# Patient Record
Sex: Female | Born: 1955 | Race: White | Hispanic: No | Marital: Married | Smoking: Never smoker
Health system: Southern US, Community
[De-identification: ages and names within clinical notes are randomized; demographics above are authoritative.]

## PROBLEM LIST (undated history)

## (undated) DIAGNOSIS — E039 Hypothyroidism, unspecified: Secondary | ICD-10-CM

## (undated) DIAGNOSIS — E785 Hyperlipidemia, unspecified: Secondary | ICD-10-CM

## (undated) DIAGNOSIS — G4730 Sleep apnea, unspecified: Secondary | ICD-10-CM

## (undated) DIAGNOSIS — I4891 Unspecified atrial fibrillation: Secondary | ICD-10-CM

## (undated) DIAGNOSIS — J302 Other seasonal allergic rhinitis: Secondary | ICD-10-CM

## (undated) HISTORY — PX: TONSILLECTOMY: SUR1361

## (undated) HISTORY — DX: Hyperlipidemia, unspecified: E78.5

---

## 2004-09-24 ENCOUNTER — Ambulatory Visit: Payer: Self-pay

## 2004-11-20 ENCOUNTER — Ambulatory Visit: Payer: Self-pay

## 2006-07-19 ENCOUNTER — Ambulatory Visit: Payer: Self-pay

## 2008-02-14 ENCOUNTER — Ambulatory Visit: Payer: Self-pay

## 2008-04-09 ENCOUNTER — Ambulatory Visit: Payer: Self-pay | Admitting: Unknown Physician Specialty

## 2009-01-14 ENCOUNTER — Ambulatory Visit: Payer: Self-pay | Admitting: Family Medicine

## 2012-07-13 ENCOUNTER — Ambulatory Visit: Payer: Self-pay | Admitting: Family Medicine

## 2013-04-09 ENCOUNTER — Ambulatory Visit: Payer: Self-pay | Admitting: Family Medicine

## 2013-06-27 ENCOUNTER — Ambulatory Visit: Payer: Self-pay | Admitting: Family Medicine

## 2015-01-27 ENCOUNTER — Ambulatory Visit (INDEPENDENT_AMBULATORY_CARE_PROVIDER_SITE_OTHER): Payer: 59 | Admitting: Family Medicine

## 2015-01-27 ENCOUNTER — Encounter: Payer: Self-pay | Admitting: Family Medicine

## 2015-01-27 VITALS — BP 128/90 | HR 80 | Temp 98.3°F | Resp 16 | Ht 68.0 in | Wt 215.2 lb

## 2015-01-27 DIAGNOSIS — R7303 Prediabetes: Secondary | ICD-10-CM | POA: Insufficient documentation

## 2015-01-27 DIAGNOSIS — J45909 Unspecified asthma, uncomplicated: Secondary | ICD-10-CM | POA: Insufficient documentation

## 2015-01-27 DIAGNOSIS — E7800 Pure hypercholesterolemia, unspecified: Secondary | ICD-10-CM | POA: Insufficient documentation

## 2015-01-27 DIAGNOSIS — E559 Vitamin D deficiency, unspecified: Secondary | ICD-10-CM | POA: Insufficient documentation

## 2015-01-27 DIAGNOSIS — J069 Acute upper respiratory infection, unspecified: Secondary | ICD-10-CM

## 2015-01-27 DIAGNOSIS — C4491 Basal cell carcinoma of skin, unspecified: Secondary | ICD-10-CM | POA: Insufficient documentation

## 2015-01-27 DIAGNOSIS — J309 Allergic rhinitis, unspecified: Secondary | ICD-10-CM | POA: Insufficient documentation

## 2015-01-27 MED ORDER — HYDROCODONE-HOMATROPINE 5-1.5 MG/5ML PO SYRP: ORAL_SOLUTION | ORAL | Status: DC

## 2015-01-27 NOTE — Patient Instructions (Signed)
Discussed use of Mucinex D for congestion, Delsym for cough, and Benadryl for postnasal drainage 

## 2015-01-27 NOTE — Progress Notes (Signed)
Subjective:     Patient ID: Sheila Bender, female   DOB: May 26, 1956, 59 y.o.   MRN: [id number]  HPI  Chief Complaint  Patient presents with  . Sore Throat    Patient comes in office today with complaints of sore throat, cough and congestion since 8/6. Patient states that her throat only hurts on the right side as well as ear ache that has developed on right side. She describes ear pain as fullness in ears. Patient reports taking otc Day/Night Quil to treat symptoms  Reports she will be having allergy testing per ENT soon.   Review of Systems  Respiratory: Negative for shortness of breath and wheezing.        Objective:   Physical Exam  Constitutional: She appears well-developed and well-nourished. No distress.  Ears: T.M's intact without inflammation Throat: no erythema. Tonsils absent. Neck: no cervical adenopathy Lungs: clear     Assessment:    1. Upper respiratory infectio - HYDROcodone-homatropine (HYCODAN) 5-1.5 MG/5ML syrup; 5 ml 4-6 hours as needed for cough  Dispense: 240 mL; Refill: 0    Plan:    Discussed use of Mucinex D for congestion, Delsym for cough, and Benadryl for postnasal drainage

## 2015-05-21 ENCOUNTER — Other Ambulatory Visit: Payer: Self-pay | Admitting: Orthopedic Surgery

## 2015-05-21 DIAGNOSIS — M2391 Unspecified internal derangement of right knee: Secondary | ICD-10-CM

## 2015-06-02 ENCOUNTER — Ambulatory Visit (HOSPITAL_COMMUNITY)
Admission: RE | Admit: 2015-06-02 | Discharge: 2015-06-02 | Disposition: A | Discharge status: Home / Self Care | Payer: 59 | Source: Ambulatory Visit | Attending: Orthopedic Surgery | Admitting: Orthopedic Surgery

## 2015-06-02 DIAGNOSIS — S83241A Other tear of medial meniscus, current injury, right knee, initial encounter: Secondary | ICD-10-CM | POA: Diagnosis not present

## 2015-06-02 DIAGNOSIS — S82144A Nondisplaced bicondylar fracture of right tibia, initial encounter for closed fracture: Secondary | ICD-10-CM | POA: Diagnosis not present

## 2015-06-02 DIAGNOSIS — W11XXXA Fall on and from ladder, initial encounter: Secondary | ICD-10-CM | POA: Insufficient documentation

## 2015-06-02 DIAGNOSIS — M2391 Unspecified internal derangement of right knee: Secondary | ICD-10-CM | POA: Diagnosis not present

## 2015-06-11 ENCOUNTER — Ambulatory Visit: Payer: Self-pay

## 2015-12-24 ENCOUNTER — Emergency Department
Admission: EM | Admit: 2015-12-24 | Discharge: 2015-12-24 | Disposition: A | Discharge status: Home / Self Care | Payer: 59 | Attending: Emergency Medicine | Admitting: Emergency Medicine

## 2015-12-24 DIAGNOSIS — R002 Palpitations: Secondary | ICD-10-CM | POA: Diagnosis not present

## 2015-12-24 DIAGNOSIS — J45909 Unspecified asthma, uncomplicated: Secondary | ICD-10-CM | POA: Diagnosis not present

## 2015-12-24 DIAGNOSIS — E785 Hyperlipidemia, unspecified: Secondary | ICD-10-CM | POA: Insufficient documentation

## 2015-12-24 LAB — CBC
HEMATOCRIT: 43.5 % (ref 35.0–47.0)
HEMOGLOBIN: 14.6 g/dL (ref 12.0–16.0)
MCH: 29.3 pg (ref 26.0–34.0)
MCHC: 33.4 g/dL (ref 32.0–36.0)
MCV: 87.7 fL (ref 80.0–100.0)
Platelets: 258 10*3/uL (ref 150–440)
RBC: 4.97 MIL/uL (ref 3.80–5.20)
RDW: 12.9 % (ref 11.5–14.5)
WBC: 10 10*3/uL (ref 3.6–11.0)

## 2015-12-24 LAB — BASIC METABOLIC PANEL
ANION GAP: 8 (ref 5–15)
BUN: 15 mg/dL (ref 6–20)
CHLORIDE: 103 mmol/L (ref 101–111)
CO2: 27 mmol/L (ref 22–32)
Calcium: 9.4 mg/dL (ref 8.9–10.3)
Creatinine, Ser: 0.86 mg/dL (ref 0.44–1.00)
GFR calc non Af Amer: >60 mL/min (ref >60)
GLUCOSE: 138 mg/dL — AB (ref 65–99)
Potassium: 3.6 mmol/L (ref 3.5–5.1)
Sodium: 138 mmol/L (ref 135–145)

## 2015-12-24 LAB — TSH: TSH: 7.988 u[IU]/mL — ABNORMAL HIGH (ref 0.350–4.500)

## 2015-12-24 LAB — TROPONIN I: Troponin I: <0.03 ng/mL (ref <0.03)

## 2015-12-24 LAB — T4, FREE: Free T4: 0.81 ng/dL (ref 0.61–1.12)

## 2015-12-24 MED ORDER — METOPROLOL TARTRATE 25 MG PO TABS
25.0000 mg | ORAL_TABLET | Freq: Once | ORAL | Status: AC
  Administered 2015-12-24: 25 mg via ORAL
  Filled 2015-12-24 (×2): qty 1

## 2015-12-24 MED ORDER — METOPROLOL TARTRATE 25 MG PO TABS: 25.0000 mg | ORAL_TABLET | Freq: Two times a day (BID) | ORAL | Status: AC

## 2015-12-24 NOTE — Discharge Instructions (Signed)

## 2015-12-24 NOTE — ED Provider Notes (Signed)
[facility] Emergency Department Provider Note        Time seen: ----------------------------------------- 9:21 PM on 12/24/2015 -----------------------------------------    I have reviewed the triage vital signs and the nursing notes.   HISTORY  Chief Complaint Palpitations    HPI Sheila Bender is a 60 y.o. female who presents to ER for palpitations since last week intermittently. Patient states she's probably had 6 or more episodes where she feels her heart is racing. Patient states it feels regular, denies recent illness or pain. She currently does not feel any palpitations, no vomiting, shortness of breath or fever. She did have episodes of nausea with the palpitations. Patient reports that several years ago she was noted to have an abnormal EKG so she underwent stress testing which was normal.   Past Medical History  Diagnosis Date  . Hyperlipidemia     Patient Active Problem List   Diagnosis Date Noted  . Allergic rhinitis 01/27/2015  . Basal cell carcinoma 01/27/2015  . Hypercholesteremia 01/27/2015  . Borderline diabetes 01/27/2015  . RAD (reactive airway disease) 01/27/2015  . Avitaminosis D 01/27/2015    Past Surgical History  Procedure Laterality Date  . Tonsillectomy      Allergies Sulfa antibiotics  Social History Social History  Substance Use Topics  . Smoking status: Never Smoker   . Smokeless tobacco: Never Used  . Alcohol Use: Not on file    Review of Systems Constitutional: Negative for fever. Cardiovascular: Negative for chest pain.Positive for palpitations Respiratory: Negative for shortness of breath. Gastrointestinal: Negative for abdominal pain, vomiting and diarrhea. Genitourinary: Negative for dysuria. Musculoskeletal: Negative for back pain. Skin: Negative for rash. Neurological: Negative for headaches, focal weakness or numbness.  10-point ROS otherwise  negative.  ____________________________________________   PHYSICAL EXAM:  VITAL SIGNS: ED Triage Vitals  Enc Vitals Group     BP 12/24/15 2019 196/86 mmHg     Pulse Rate 12/24/15 2019 88     Resp 12/24/15 2019 18     Temp 12/24/15 2019 98.2 F (36.8 C)     Temp Source 12/24/15 2019 Oral     SpO2 12/24/15 2019 100 %     Weight 12/24/15 2019 212 lb (96.163 kg)     Height 12/24/15 2019 5\' 8"  (1.727 m)     Head Cir --      Peak Flow --      Pain Score --      Pain Loc --      Pain Edu? --      Excl. in [place]? --     Constitutional: Alert and oriented. Well appearing and in no distress. Eyes: Conjunctivae are normal. PERRL. Normal extraocular movements. ENT   Head: Normocephalic and atraumatic.   Nose: No congestion/rhinnorhea.   Mouth/Throat: Mucous membranes are moist.   Neck: No stridor. Cardiovascular: Normal rate, regular rhythm. No murmurs, rubs, or gallops. Respiratory: Normal respiratory effort without tachypnea nor retractions. Breath sounds are clear and equal bilaterally. No wheezes/rales/rhonchi. Gastrointestinal: Soft and nontender. Normal bowel sounds Musculoskeletal: Nontender with normal range of motion in all extremities. No lower extremity tenderness nor edema. Neurologic:  Normal speech and language. No gross focal neurologic deficits are appreciated.  Skin:  Skin is warm, dry and intact. No rash noted. Psychiatric: Mood and affect are normal. Speech and behavior are normal.  ____________________________________________  EKG: Interpreted by me. Sinus rhythm rate 89 bpm, normal PR interval, normal QRS width, normal QT interval. LVH with repolarization, possible septal  infarct age indeterminate  ____________________________________________  ED COURSE:  Pertinent labs & imaging results that were available during my care of the patient were reviewed by me and considered in my medical decision making (see chart for details). Patient presents to ER  with palpitations of unclear etiology. We'll check basic labs and observe on the monitor. ____________________________________________    LABS (pertinent positives/negatives)  Labs Reviewed  BASIC METABOLIC PANEL - Abnormal; Notable for the following:    Glucose, Bld 138 (*)    All other components within normal limits  CBC  TROPONIN I  TSH  T4, FREE   ____________________________________________  FINAL ASSESSMENT AND PLAN  Palpitations  Plan: Patient with labs as dictated above. Patient observed on the monitor to have an occasional PVC. I will place her on low-dose metoprolol and she was given the first dose tonight. She will be advised to have close follow-up with cardiology for evaluation.   Earleen Newport, MD   Note: This dictation was prepared with Dragon dictation. Any transcriptional errors that result from this process are unintentional   Earleen Newport, MD 12/24/15 2200

## 2015-12-24 NOTE — ED Notes (Signed)
Pt in with co palpitations since last week intermittently. Has never been seen for the same by pmd, states does not feel palpitations at this time, no n,v,d or dysuria.

## 2015-12-25 DIAGNOSIS — R002 Palpitations: Secondary | ICD-10-CM | POA: Insufficient documentation

## 2016-01-01 ENCOUNTER — Encounter: Payer: Self-pay | Admitting: Family Medicine

## 2016-01-01 ENCOUNTER — Ambulatory Visit (INDEPENDENT_AMBULATORY_CARE_PROVIDER_SITE_OTHER): Payer: 59 | Admitting: Family Medicine

## 2016-01-01 VITALS — BP 140/92 | HR 72 | Temp 98.2°F | Resp 16 | Wt 221.0 lb

## 2016-01-01 DIAGNOSIS — E7800 Pure hypercholesterolemia, unspecified: Secondary | ICD-10-CM | POA: Diagnosis not present

## 2016-01-01 DIAGNOSIS — R7989 Other specified abnormal findings of blood chemistry: Secondary | ICD-10-CM

## 2016-01-01 DIAGNOSIS — R002 Palpitations: Secondary | ICD-10-CM | POA: Diagnosis not present

## 2016-01-01 DIAGNOSIS — R946 Abnormal results of thyroid function studies: Secondary | ICD-10-CM | POA: Diagnosis not present

## 2016-01-01 NOTE — Progress Notes (Signed)
Subjective:     Patient ID: Sheila Bender, female   DOB: 11-13-1955, 60 y.o.   MRN: [id number]  HPI  Chief Complaint  Patient presents with  . Abnormal Lab    Pt was seen in ED on 12/24/2015 for palpitations. Was found to have a TSH of 7.998. Was advised by cardiologist to see PCP for possible hypothyroidism. Has not had any palpititions since starting Metoprolol. Denies heat intolerance, bowel changes, swelling, BM changes.  States she has a Holter monitor and echocardiogram pending. She was told they were going to set up a sleep study as well. Does not report any hypothyroid sx.   Review of Systems     Objective:   Physical Exam  Constitutional: She appears well-developed and well-nourished. No distress.  Neck: No thyromegaly present.  Cardiovascular: Normal rate and regular rhythm.   Pulmonary/Chest: Breath sounds normal.       Assessment:    1. Palpitations: complete evaluation per cardiology.  2. Elevated TSH: Normal free T4  3. Hypercholesteremia - Lipid panel    Plan:    Provided with Epworth screen. We will repeat thyroid labs in 4 weeks or sooner if having symptoms. She will provide cardiology with copy of lipid profile when available.

## 2016-01-01 NOTE — Patient Instructions (Signed)
Complete Epworth screen. Complete cardiology evaluation. Repeat thyroid labs in 4 weeks. We will call you about the thyroid test labs.

## 2016-01-02 LAB — LIPID PANEL
Chol/HDL Ratio: 2.8 ratio units (ref 0.0–4.4)
Cholesterol, Total: 199 mg/dL (ref 100–199)
HDL: 71 mg/dL (ref >39)
LDL Calculated: 100 mg/dL — ABNORMAL HIGH (ref 0–99)
Triglycerides: 138 mg/dL (ref 0–149)
VLDL CHOLESTEROL CAL: 28 mg/dL (ref 5–40)

## 2016-03-03 ENCOUNTER — Other Ambulatory Visit: Payer: Self-pay | Admitting: Physician Assistant

## 2016-03-03 DIAGNOSIS — Z1231 Encounter for screening mammogram for malignant neoplasm of breast: Secondary | ICD-10-CM

## 2016-03-25 ENCOUNTER — Ambulatory Visit
Admission: RE | Admit: 2016-03-25 | Discharge: 2016-03-25 | Disposition: A | Discharge status: Home / Self Care | Payer: 59 | Source: Ambulatory Visit | Attending: Physician Assistant | Admitting: Physician Assistant

## 2016-03-25 DIAGNOSIS — Z1231 Encounter for screening mammogram for malignant neoplasm of breast: Secondary | ICD-10-CM | POA: Insufficient documentation

## 2016-06-10 ENCOUNTER — Other Ambulatory Visit
Admission: RE | Admit: 2016-06-10 | Discharge: 2016-06-10 | Disposition: A | Discharge status: Home / Self Care | Payer: 59 | Source: Ambulatory Visit | Attending: Internal Medicine | Admitting: Internal Medicine

## 2016-06-10 DIAGNOSIS — M25461 Effusion, right knee: Secondary | ICD-10-CM | POA: Insufficient documentation

## 2016-06-10 DIAGNOSIS — M25561 Pain in right knee: Secondary | ICD-10-CM | POA: Insufficient documentation

## 2016-06-10 DIAGNOSIS — G8929 Other chronic pain: Secondary | ICD-10-CM | POA: Diagnosis present

## 2016-06-10 LAB — SYNOVIAL CELL COUNT + DIFF, W/ CRYSTALS
Crystals, Fluid: NONE SEEN
EOSINOPHILS-SYNOVIAL: 0 %
LYMPHOCYTES-SYNOVIAL FLD: 55 %
MONOCYTE-MACROPHAGE-SYNOVIAL FLUID: 28 %
NEUTROPHIL, SYNOVIAL: 17 %
WBC, Synovial: 761 /mm3 — ABNORMAL HIGH (ref 0–200)

## 2016-07-23 ENCOUNTER — Encounter
Admission: RE | Admit: 2016-07-23 | Discharge: 2016-07-23 | Disposition: A | Discharge status: Home / Self Care | Payer: 59 | Source: Ambulatory Visit | Attending: Orthopedic Surgery | Admitting: Orthopedic Surgery

## 2016-07-23 DIAGNOSIS — Z01818 Encounter for other preprocedural examination: Secondary | ICD-10-CM | POA: Diagnosis not present

## 2016-07-23 DIAGNOSIS — Z8679 Personal history of other diseases of the circulatory system: Secondary | ICD-10-CM | POA: Diagnosis not present

## 2016-07-23 HISTORY — DX: Other seasonal allergic rhinitis: J30.2

## 2016-07-23 HISTORY — DX: Sleep apnea, unspecified: G47.30

## 2016-07-23 HISTORY — DX: Unspecified atrial fibrillation: I48.91

## 2016-07-23 HISTORY — DX: Hypothyroidism, unspecified: E03.9

## 2016-07-23 NOTE — Pre-Procedure Instructions (Signed)
Component Name Value Ref Range  LV Ejection Fraction (%) 55   Aortic Valve Stenosis Grade none   Aortic Valve Regurgitation Grade none   Aortic Valve Max Velocity (m/s) 1.7 m/sec   Aortic Valve Stenosis Mean Gradient (mmHg) 5.0 mmHg   Mitral Valve Regurgitation Grade mild   Mitral Valve Stenosis Grade none   Tricuspid Valve Regurgitation Grade mild   Tricuspid Valve Regurgitation Max Velocity (m/s) 3.1 m/sec   Right Ventricle Systolic Pressure (mmHg) XX123456 mmHg   LV End Diastolic Diameter (cm) 5.3 cm  LV End Systolic Diameter (cm) 2.9 cm  LV Septum Wall Thickness (cm) 1.1 cm  LV Posterior Wall Thickness (cm) 1.0 cm  Left Atrium Diameter (cm) 3.8 cm  Result Narrative   CARDIOLOGY DEPARTMENT KIERSYN, DRUKER [id number]  [facility] [id number]  [address] [place], [place] [address]Date: 01/12/2016 02:27 PM  Adult Female Age: 61 yrs  ECHOCARDIOGRAM REPORT Outpatient  KC::KCWC STUDY:CHEST WALL TAPE:0000:00: 0:00:00 MD1:MELVILLE, BONNIE JEAN  ECHO:Yes DOPPLER:YesFILE:0000-000-000 BP: 130/90 mmHg COLOR:YesCONTRAST:No MACHINE:[place] Height: 67 in RV BIOPSY:No 3D:NoSOUND QLTY:ModerateWeight: 220 lb  MEDIUM:None BSA: 2.1 m2  ___________________________________________________________________________________________  HISTORY:Palpitations REASON:Assess, LV function INDICATION:R00.2  Palpitations ___________________________________________________________________________________________  ECHOCARDIOGRAPHIC MEASUREMENTS 2D DIMENSIONS AORTA ValuesNormal RangeMAIN PAValuesNormal Range Annulus:nm* [2.1 - 2.5]PA Main:nm* [1.5 - 2.1] Aorta Sin:nm* [2.7 - 3.3] RIGHT VENTRICLE ST Junction:nm* [2.3 - 2.9]RV Base:nm* [ < 4.2] Asc.Aorta:nm* [2.3 - 3.1] RV Mid:3.5 cm[ < 3.5]  LEFT VENTRICLERV Length:nm* [ < 8.6] LVIDd:5.3 cm[3.9 - 5.3] INFERIOR VENA CAVA LVIDs:2.9 cmMax. IVC:nm* [ <= 2.1]  FS:44.5 %[> 25]Min. IVC:nm* SWT:1.1 cm[0.5 - 0.9] ------------------ PWT:1.00 cm [0.5 - 0.9] nm* - not measured  LEFT ATRIUM LA Diam:3.8 cm[2.7 - 3.8] LA A4C Area:nm* [ < 20] LA Volume:nm* [22 - 52] ___________________________________________________________________________________________  ECHOCARDIOGRAPHIC DESCRIPTIONS  AORTIC ROOT Size:Normal Dissection:INDETERM FOR DISSECTION  AORTIC VALVE Leaflets:Tricuspid Morphology:Normal Mobility:Fully mobile  LEFT VENTRICLE Size:NormalAnterior:Normal  Contraction:Normal Lateral:Normal Closest EF:>55% (Estimated)Septal:Normal  LV Masses:No Masses Apical:Normal  [id number] Posterior:Normal Dias.FxClass:(Grade 1) relaxation abnormal, E/A reversal  MITRAL VALVE Leaflets:NormalMobility:Fully  mobile Morphology:Normal  LEFT ATRIUM Size:Normal LA Masses:No masses  IA Septum:Normal IAS  MAIN PA Size:Normal  PULMONIC VALVE Morphology:NormalMobility:Fully mobile  RIGHT VENTRICLE  RV Masses:No Masses Size:Normal  Free Wall:Normal Contraction:Normal  TRICUSPID VALVE Leaflets:NormalMobility:Fully mobile Morphology:Normal  RIGHT ATRIUM Size:NormalRA Other:None  RA Mass:No masses  PERICARDIUM  Fluid:No effusion  INFERIOR VENACAVA Size:Normal Normal respiratory collapse  _____________________________________________________________________  DOPPLER ECHO and OTHER SPECIAL PROCEDURES  Aortic:No ARNo AS 166.0 cm/sec peak vel11.0 mmHg peak grad 5.0 mmHg mean grad 3.2 cm^2 by DOPPLER   Mitral:MILD MRNo MS  5.1 cm^2 by DOPPLER MV Inflow E Vel=111.0 cm/sec MV Annulus E'Vel=6.2 cm/sec E/E'Ratio=17.9  Tricuspid:MILD TRNo TS 314.0 cm/sec peak TR vel 44.4 mmHg peak RV pressure  Pulmonary:TRIVIAL PR No PS    ___________________________________________________________________________________________  INTERPRETATION NORMAL LEFT VENTRICULAR SYSTOLIC FUNCTION WITH AN ESTIMATED EF = 60 % NORMAL RIGHT VENTRICULAR SYSTOLIC FUNCTION MILD TRICUSPID AND MITRAL VALVE REGURGITATION NO VALVULAR STENOSIS  ___________________________________________________________________________________________  Electronically signed by: MD Serafina Royals on 01/12/2016 04:00 PM Performed By: Johnathan Hausen, RDCS, RVT Ordering Physician: Etta Quill ___________________________________________________________________________________________  Status Results Details    Appointment on 01/12/2016 [place]")' [email or web address]/">Encounter Summary

## 2016-07-23 NOTE — Patient Instructions (Signed)
  Your procedure is scheduled on: 07/29/16 Thurs Report to Same Day Surgery 2nd floor medical mall [place] Entrance-take elevator on left to 2nd floor.  Check in with surgery information desk.) To find out your arrival time please call [phone] between 1PM - 3PM on 07/28/16 Wed  Remember: Instructions that are not followed completely may result in serious medical risk, up to and including death, or upon the discretion of your surgeon and anesthesiologist your surgery may need to be rescheduled.    _x___ 1. Do not eat food or drink liquids after midnight. No gum chewing or hard candies.     __x__ 2. No Alcohol for 24 hours before or after surgery.   __x__3. No Smoking for 24 prior to surgery.   ____  4. Bring all medications with you on the day of surgery if instructed.    __x__ 5. Notify your doctor if there is any change in your medical condition     (cold, fever, infections).     Do not wear jewelry, make-up, hairpins, clips or nail polish.  Do not wear lotions, powders, or perfumes. You may wear deodorant.  Do not shave 48 hours prior to surgery. Men may shave face and neck.  Do not bring valuables to the hospital.    [facility] is not responsible for any belongings or valuables.               Contacts, dentures or bridgework may not be worn into surgery.  Leave your suitcase in the car. After surgery it may be brought to your room.  For patients admitted to the hospital, discharge time is determined by your treatment team.   Patients discharged the day of surgery will not be allowed to drive home.  You will need someone to drive you home and stay with you the night of your procedure.    Please read over the following fact sheets that you were given:   [facility] Preparing for Surgery and or MRSA Information   _x___ Take these medicines the morning of surgery with A SIP OF WATER:    1. metoprolol tartrate   2.  3.  4.  5.  6.  ____Fleets enema or Magnesium  Citrate as directed.   _x___ Use CHG Soap or sage wipes as directed on instruction sheet   ____ Use inhalers on the day of surgery and bring to hospital day of surgery  ____ Stop metformin 2 days prior to surgery    ____ Take 1/2 of usual insulin dose the night before surgery and none on the morning of           surgery.   _x___ Stop Aspirin, Coumadin, Pllavix ,Eliquis, Effient, or Pradaxa  Ask cardiologist if OK to spot Eliquis 2 days before surgery.  x__ Stop Anti-inflammatories such as Advil, Aleve, Ibuprofen, Motrin, Naproxen,          Naprosyn, Goodies powders or aspirin products. Ok to take Tylenol.   ____ Stop supplements until after surgery.    __x__ Bring C-Pap to the hospital.

## 2016-07-26 NOTE — Pre-Procedure Instructions (Addendum)
EKG TO DR Nehemiah Massed. SPOKE WITH PATRICIA

## 2016-07-28 NOTE — Pre-Procedure Instructions (Signed)
CLEARED BY DR KOWALSKI 

## 2016-07-29 ENCOUNTER — Encounter
Admission: RE | Disposition: A | Discharge status: Home / Self Care | Payer: Self-pay | Source: Ambulatory Visit | Attending: Orthopedic Surgery

## 2016-07-29 ENCOUNTER — Encounter: Payer: Self-pay | Admitting: *Deleted

## 2016-07-29 ENCOUNTER — Ambulatory Visit: Payer: 59 | Admitting: Anesthesiology

## 2016-07-29 ENCOUNTER — Ambulatory Visit
Admission: RE | Admit: 2016-07-29 | Discharge: 2016-07-29 | Disposition: A | Discharge status: Home / Self Care | Payer: 59 | Source: Ambulatory Visit | Attending: Orthopedic Surgery | Admitting: Orthopedic Surgery

## 2016-07-29 DIAGNOSIS — S83241A Other tear of medial meniscus, current injury, right knee, initial encounter: Secondary | ICD-10-CM | POA: Insufficient documentation

## 2016-07-29 DIAGNOSIS — E039 Hypothyroidism, unspecified: Secondary | ICD-10-CM | POA: Insufficient documentation

## 2016-07-29 DIAGNOSIS — M2241 Chondromalacia patellae, right knee: Secondary | ICD-10-CM | POA: Diagnosis not present

## 2016-07-29 DIAGNOSIS — Z79899 Other long term (current) drug therapy: Secondary | ICD-10-CM | POA: Diagnosis not present

## 2016-07-29 DIAGNOSIS — X58XXXA Exposure to other specified factors, initial encounter: Secondary | ICD-10-CM | POA: Insufficient documentation

## 2016-07-29 DIAGNOSIS — M25561 Pain in right knee: Secondary | ICD-10-CM | POA: Diagnosis present

## 2016-07-29 DIAGNOSIS — M1711 Unilateral primary osteoarthritis, right knee: Secondary | ICD-10-CM | POA: Diagnosis not present

## 2016-07-29 DIAGNOSIS — G4730 Sleep apnea, unspecified: Secondary | ICD-10-CM | POA: Insufficient documentation

## 2016-07-29 DIAGNOSIS — M659 Synovitis and tenosynovitis, unspecified: Secondary | ICD-10-CM | POA: Insufficient documentation

## 2016-07-29 DIAGNOSIS — I4891 Unspecified atrial fibrillation: Secondary | ICD-10-CM | POA: Insufficient documentation

## 2016-07-29 DIAGNOSIS — Z9989 Dependence on other enabling machines and devices: Secondary | ICD-10-CM | POA: Insufficient documentation

## 2016-07-29 DIAGNOSIS — Z7901 Long term (current) use of anticoagulants: Secondary | ICD-10-CM | POA: Diagnosis not present

## 2016-07-29 HISTORY — PX: KNEE ARTHROSCOPY WITH MEDIAL MENISECTOMY: SHX5651

## 2016-07-29 SURGERY — ARTHROSCOPY, KNEE, WITH MEDIAL MENISCECTOMY
Anesthesia: General | Laterality: Right

## 2016-07-29 MED ORDER — LACTATED RINGERS IV SOLN
INTRAVENOUS | Status: DC | PRN
  Administered 2016-07-29: 10:00:00 via INTRAVENOUS

## 2016-07-29 MED ORDER — GLYCOPYRROLATE 0.2 MG/ML IJ SOLN
INTRAMUSCULAR | Status: AC
  Filled 2016-07-29: qty 1

## 2016-07-29 MED ORDER — MIDAZOLAM HCL 2 MG/2ML IJ SOLN
INTRAMUSCULAR | Status: AC
  Filled 2016-07-29: qty 2

## 2016-07-29 MED ORDER — FENTANYL CITRATE (PF) 100 MCG/2ML IJ SOLN
INTRAMUSCULAR | Status: DC
Start: 2016-07-29 — End: 2016-07-29
  Filled 2016-07-29: qty 2

## 2016-07-29 MED ORDER — OXYCODONE-ACETAMINOPHEN 5-325 MG PO TABS: 1.0000 | ORAL_TABLET | ORAL | Status: DC | PRN

## 2016-07-29 MED ORDER — METOCLOPRAMIDE HCL 5 MG/ML IJ SOLN: 5.0000 mg | Freq: Three times a day (TID) | INTRAMUSCULAR | Status: DC | PRN

## 2016-07-29 MED ORDER — LIDOCAINE HCL (PF) 2 % IJ SOLN
INTRAMUSCULAR | Status: AC
  Filled 2016-07-29: qty 2

## 2016-07-29 MED ORDER — LACTATED RINGERS IV SOLN
INTRAVENOUS | Status: DC
  Administered 2016-07-29: 10:00:00 via INTRAVENOUS

## 2016-07-29 MED ORDER — FENTANYL CITRATE (PF) 100 MCG/2ML IJ SOLN
25.0000 ug | INTRAMUSCULAR | Status: DC | PRN
  Administered 2016-07-29 (×4): 25 ug via INTRAVENOUS

## 2016-07-29 MED ORDER — FAMOTIDINE 20 MG PO TABS
20.0000 mg | ORAL_TABLET | Freq: Once | ORAL | Status: AC
  Administered 2016-07-29: 20 mg via ORAL

## 2016-07-29 MED ORDER — ONDANSETRON HCL 4 MG/2ML IJ SOLN
INTRAMUSCULAR | Status: AC
  Filled 2016-07-29: qty 2

## 2016-07-29 MED ORDER — ACETAMINOPHEN 10 MG/ML IV SOLN
INTRAVENOUS | Status: AC
  Filled 2016-07-29: qty 100

## 2016-07-29 MED ORDER — ONDANSETRON HCL 4 MG/2ML IJ SOLN: 4.0000 mg | Freq: Once | INTRAMUSCULAR | Status: DC | PRN

## 2016-07-29 MED ORDER — EPHEDRINE 5 MG/ML INJ
INTRAVENOUS | Status: AC
  Filled 2016-07-29: qty 10

## 2016-07-29 MED ORDER — ONDANSETRON HCL 4 MG/2ML IJ SOLN: 4.0000 mg | Freq: Four times a day (QID) | INTRAMUSCULAR | Status: DC | PRN

## 2016-07-29 MED ORDER — DEXAMETHASONE SODIUM PHOSPHATE 10 MG/ML IJ SOLN
INTRAMUSCULAR | Status: AC
  Filled 2016-07-29: qty 1

## 2016-07-29 MED ORDER — METOCLOPRAMIDE HCL 10 MG PO TABS: 5.0000 mg | ORAL_TABLET | Freq: Three times a day (TID) | ORAL | Status: DC | PRN

## 2016-07-29 MED ORDER — BUPIVACAINE-EPINEPHRINE (PF) 0.5% -1:200000 IJ SOLN
INTRAMUSCULAR | Status: DC | PRN
  Administered 2016-07-29: 20 mL

## 2016-07-29 MED ORDER — ONDANSETRON HCL 4 MG/2ML IJ SOLN
INTRAMUSCULAR | Status: DC | PRN
  Administered 2016-07-29: 4 mg via INTRAVENOUS

## 2016-07-29 MED ORDER — FAMOTIDINE 20 MG PO TABS
ORAL_TABLET | ORAL | Status: AC
  Administered 2016-07-29: 20 mg via ORAL
  Filled 2016-07-29: qty 1

## 2016-07-29 MED ORDER — PROPOFOL 10 MG/ML IV BOLUS
INTRAVENOUS | Status: DC | PRN
  Administered 2016-07-29: 150 mg via INTRAVENOUS

## 2016-07-29 MED ORDER — FENTANYL CITRATE (PF) 100 MCG/2ML IJ SOLN
INTRAMUSCULAR | Status: AC
Start: 2016-07-29 — End: ?
  Filled 2016-07-29: qty 2

## 2016-07-29 MED ORDER — PROPOFOL 10 MG/ML IV BOLUS
INTRAVENOUS | Status: AC
  Filled 2016-07-29: qty 20

## 2016-07-29 MED ORDER — LIDOCAINE HCL (CARDIAC) 20 MG/ML IV SOLN
INTRAVENOUS | Status: DC | PRN
  Administered 2016-07-29: 75 mg via INTRAVENOUS

## 2016-07-29 MED ORDER — EPHEDRINE SULFATE 50 MG/ML IJ SOLN
INTRAMUSCULAR | Status: DC | PRN
  Administered 2016-07-29: 10 mg via INTRAVENOUS

## 2016-07-29 MED ORDER — ONDANSETRON HCL 4 MG PO TABS: 4.0000 mg | ORAL_TABLET | Freq: Four times a day (QID) | ORAL | Status: DC | PRN

## 2016-07-29 MED ORDER — ACETAMINOPHEN 10 MG/ML IV SOLN
INTRAVENOUS | Status: DC | PRN
  Administered 2016-07-29: 1000 mg via INTRAVENOUS

## 2016-07-29 MED ORDER — OXYCODONE HCL 5 MG PO TABS: 5.0000 mg | ORAL_TABLET | ORAL | 0 refills | Status: AC | PRN

## 2016-07-29 MED ORDER — SODIUM CHLORIDE 0.9 % IV SOLN: INTRAVENOUS | Status: DC

## 2016-07-29 MED ORDER — FENTANYL CITRATE (PF) 100 MCG/2ML IJ SOLN
INTRAMUSCULAR | Status: DC | PRN
  Administered 2016-07-29: 25 ug via INTRAVENOUS
  Administered 2016-07-29: 50 ug via INTRAVENOUS
  Administered 2016-07-29: 25 ug via INTRAVENOUS

## 2016-07-29 MED ORDER — DEXAMETHASONE SODIUM PHOSPHATE 10 MG/ML IJ SOLN
INTRAMUSCULAR | Status: DC | PRN
  Administered 2016-07-29: 10 mg via INTRAVENOUS

## 2016-07-29 MED ORDER — MIDAZOLAM HCL 2 MG/2ML IJ SOLN
INTRAMUSCULAR | Status: DC | PRN
  Administered 2016-07-29: 2 mg via INTRAVENOUS

## 2016-07-29 SURGICAL SUPPLY — 28 items
BANDAGE ACE 4X5 VEL STRL LF (GAUZE/BANDAGES/DRESSINGS) IMPLANT
BANDAGE ELASTIC 4 LF NS (GAUZE/BANDAGES/DRESSINGS) ×2 IMPLANT
BLADE FULL RADIUS 3.5 (BLADE) IMPLANT
BLADE INCISOR PLUS 4.5 (BLADE) ×2 IMPLANT
BLADE SHAVER 4.5 DBL SERAT CV (CUTTER) IMPLANT
BLADE SHAVER 4.5X7 STR FR (MISCELLANEOUS) IMPLANT
CHLORAPREP W/TINT 26ML (MISCELLANEOUS) ×2 IMPLANT
CUFF TOURN 24 STER (MISCELLANEOUS) IMPLANT
CUFF TOURN 30 STER DUAL PORT (MISCELLANEOUS) IMPLANT
GAUZE SPONGE 4X4 12PLY STRL (GAUZE/BANDAGES/DRESSINGS) ×2 IMPLANT
GLOVE SURG SYN 9.0  PF PI (GLOVE) ×1
GLOVE SURG SYN 9.0 PF PI (GLOVE) ×1 IMPLANT
GOWN SRG 2XL LVL 4 RGLN SLV (GOWNS) ×1 IMPLANT
GOWN STRL NON-REIN 2XL LVL4 (GOWNS) ×1
GOWN STRL REUS W/ TWL LRG LVL3 (GOWN DISPOSABLE) ×2 IMPLANT
GOWN STRL REUS W/TWL LRG LVL3 (GOWN DISPOSABLE) ×2
IV LACTATED RINGER IRRG 3000ML (IV SOLUTION) ×2
IV LR IRRIG 3000ML ARTHROMATIC (IV SOLUTION) ×2 IMPLANT
KIT RM TURNOVER STRD PROC AR (KITS) ×2 IMPLANT
MANIFOLD NEPTUNE II (INSTRUMENTS) ×2 IMPLANT
PACK ARTHROSCOPY KNEE (MISCELLANEOUS) ×2 IMPLANT
SET TUBE SUCT SHAVER OUTFL 24K (TUBING) ×2 IMPLANT
SET TUBE TIP INTRA-ARTICULAR (MISCELLANEOUS) ×2 IMPLANT
SUT ETHILON 4-0 (SUTURE) ×1
SUT ETHILON 4-0 FS2 18XMFL BLK (SUTURE) ×1
SUTURE ETHLN 4-0 FS2 18XMF BLK (SUTURE) ×1 IMPLANT
TUBING ARTHRO INFLOW-ONLY STRL (TUBING) ×2 IMPLANT
WAND HAND CNTRL MULTIVAC 50 (MISCELLANEOUS) ×2 IMPLANT

## 2016-07-29 NOTE — Anesthesia Post-op Follow-up Note (Cosign Needed)
Anesthesia QCDR form completed.        

## 2016-07-29 NOTE — H&P (Signed)
Reviewed paper H+P, will be scanned into chart. No changes noted.  

## 2016-07-29 NOTE — Anesthesia Procedure Notes (Signed)
Procedure Name: LMA Insertion Date/Time: 07/29/2016 10:10 AM Performed by: Darlyne Russian Pre-anesthesia Checklist: Patient identified, Emergency Drugs available, Suction available, Patient being monitored and Timeout performed Patient Re-evaluated:Patient Re-evaluated prior to inductionOxygen Delivery Method: Circle system utilized Preoxygenation: Pre-oxygenation with 100% oxygen Intubation Type: IV induction Ventilation: Mask ventilation without difficulty LMA: LMA inserted LMA Size: 4.0 Number of attempts: 1 Placement Confirmation: positive ETCO2 and breath sounds checked- equal and bilateral Tube secured with: Tape Dental Injury: Teeth and Oropharynx as per pre-operative assessment

## 2016-07-29 NOTE — Progress Notes (Signed)
When leaving patient began to feel slightly nauseated. Peppermint oil on cotton ball given to patient which seemed to help. Emesis bag also given to patient for travel.

## 2016-07-29 NOTE — Discharge Instructions (Addendum)
Keep dressing clean and dry. If Ace wrap side some leg, remove entire bandage and covered to incisions with Band-Aids then reapply Ace wrap. Pain medicine as directed. Resume eliquis tomorrow   AMBULATORY SURGERY  DISCHARGE INSTRUCTIONS   1) The drugs that you were given will stay in your system until tomorrow so for the next 24 hours you should not:  A) Drive an automobile B) Make any legal decisions C) Drink any alcoholic beverage   2) You may resume regular meals tomorrow.  Today it is better to start with liquids and gradually work up to solid foods.  You may eat anything you prefer, but it is better to start with liquids, then soup and crackers, and gradually work up to solid foods.   3) Please notify your doctor immediately if you have any unusual bleeding, trouble breathing, redness and pain at the surgery site, drainage, fever, or pain not relieved by medication.    4) Additional Instructions:        Please contact your physician with any problems or Same Day Surgery at [phone], Monday through Friday 6 am to 4 pm, or [place] [facility] number at [phone].

## 2016-07-29 NOTE — Transfer of Care (Signed)
Immediate Anesthesia Transfer of Care Note  Patient: Sheila Bender  Procedure(s) Performed: Procedure(s): KNEE ARTHROSCOPY WITH PARTIAL MEDIAL MENISECTOMY AND SYNOVECTOMY (Right)  Patient Location: PACU  Anesthesia Type:General  Level of Consciousness: awake, alert  and oriented  Airway & Oxygen Therapy: Patient Spontanous Breathing and Patient connected to nasal cannula oxygen  Post-op Assessment: Report given to RN and Post -op Vital signs reviewed and stable  Post vital signs: Reviewed and stable  Last Vitals:  Vitals:   07/29/16 1111  BP: (!) 152/86  Pulse: 74  Resp: 19  Temp: 36.1 C    Last Pain:  Vitals:   07/29/16 1111  TempSrc: Temporal         Complications: No apparent anesthesia complications

## 2016-07-29 NOTE — Anesthesia Postprocedure Evaluation (Signed)
Anesthesia Post Note  Patient: Sheila Bender  Procedure(s) Performed: Procedure(s) (LRB): KNEE ARTHROSCOPY WITH PARTIAL MEDIAL MENISECTOMY AND SYNOVECTOMY (Right)  Patient location during evaluation: PACU Anesthesia Type: General Level of consciousness: awake Pain management: pain level controlled Vital Signs Assessment: post-procedure vital signs reviewed and stable Cardiovascular status: stable Anesthetic complications: no     Last Vitals:  Vitals:   07/29/16 1142 07/29/16 1152  BP: (!) 136/95 (!) 166/88  Pulse: (!) 59 63  Resp: 13 18  Temp: 36.3 C 36.6 C    Last Pain:  Vitals:   07/29/16 1152  TempSrc: Tympanic  PainSc: 4                  VAN STAVEREN,Rigel Filsinger

## 2016-07-29 NOTE — Anesthesia Preprocedure Evaluation (Signed)
Anesthesia Evaluation  Patient identified by MRN, date of birth, ID band Patient awake    Reviewed: Allergy & Precautions, NPO status , Patient's Chart, lab work & pertinent test results  Airway Mallampati: III  TM Distance: <3 FB Neck ROM: Full    Dental  (+) Teeth Intact   Pulmonary sleep apnea and Continuous Positive Airway Pressure Ventilation ,    breath sounds clear to auscultation       Cardiovascular Exercise Tolerance: Good  Rhythm:Regular Rate:Normal     Neuro/Psych    GI/Hepatic negative GI ROS, Neg liver ROS,   Endo/Other  Hypothyroidism   Renal/GU negative Renal ROS     Musculoskeletal   Abdominal (+) + obese,   Peds negative pediatric ROS (+)  Hematology negative hematology ROS (+)   Anesthesia Other Findings   Reproductive/Obstetrics                             Anesthesia Physical Anesthesia Plan  ASA: II  Anesthesia Plan: General   Post-op Pain Management:    Induction: Intravenous  Airway Management Planned: LMA  Additional Equipment:   Intra-op Plan:   Post-operative Plan: Extubation in OR  Informed Consent: I have reviewed the patients History and Physical, chart, labs and discussed the procedure including the risks, benefits and alternatives for the proposed anesthesia with the patient or authorized representative who has indicated his/her understanding and acceptance.     Plan Discussed with: CRNA and Surgeon  Anesthesia Plan Comments:         Anesthesia Quick Evaluation

## 2016-07-29 NOTE — Op Note (Signed)
07/29/2016  11:09 AM  PATIENT:  Sheila Bender  61 y.o. female  PRE-OPERATIVE DIAGNOSIS:  arthritis of right knee, medial meniscus tear  POST-OPERATIVE DIAGNOSIS:  arthritis of right knee, medial meniscus tear  PROCEDURE:  Procedure(s): KNEE ARTHROSCOPY WITH PARTIAL MEDIAL MENISECTOMY AND SYNOVECTOMY (Right)  SURGEON: Laurene Footman, MD  ASSISTANTS: None  ANESTHESIA:   general  EBL:  Total I/O In: 600 [I.V.:600] Out: 5 [Blood:5]  BLOOD ADMINISTERED:none  DRAINS: none   LOCAL MEDICATIONS USED:  MARCAINE     SPECIMEN:  No Specimen  DISPOSITION OF SPECIMEN:  N/A  COUNTS:  YES  TOURNIQUET:  * Missing tourniquet times found for documented tourniquets in log:  [id number] *  IMPLANTS: None  DICTATION: .Dragon Dictation patient brought the operating room and after adequate anesthesia was obtained, the right leg was placed in arthroscopic leg holder with tourniquet applied. After prepping and draping in the usual sterile fashion, appropriate patient identification and timeout procedures were completed. An inferior lateral portal was made and inspection revealed moderate patellofemoral chondromalacia with no loose bodies present in the gutters. There is moderate synovitis in suprapatellar pouch along the medial and lateral gutters. The anterior medially and anterior medial portal was made and extensive tear of the posterior horn of the medial meniscus was identified as well as fibrillated carpal cartilage on both femoral and tibial condyles with some fissuring down to the bone. The meniscus was debrided with meniscal punch followed by a shaver and ArthriCare wand to a stable margin resecting approximately two thirds the posterior third of the meniscus PCL was intact and lateral compartment was relatively normal just a few areas of chondral flaps and full-thickness lesions nominally in full the weightbearing portion and full extension the meniscus itself was intact there is also significant  synovitis within the anterior compartment shaver isn't resume utilizing this time to get raised to perform synovectomy in the suprapatellar pouch medial lateral gutters and anteriorly the instrumentation was withdrawn at the close the case with thorough irrigation of the knee infiltration of 20 cc of half percent Sensorcaine into the knee and simple interrupted closure of the skin incisions with 4-0 nylon Xeroform 4 x 4 web roll and Ace wrap applied.  PLAN OF CARE: Discharge to home after PACU  PATIENT DISPOSITION:  PACU - hemodynamically stable.

## 2017-05-11 ENCOUNTER — Other Ambulatory Visit: Payer: Self-pay | Admitting: Physician Assistant

## 2017-05-11 DIAGNOSIS — Z1231 Encounter for screening mammogram for malignant neoplasm of breast: Secondary | ICD-10-CM

## 2017-06-02 ENCOUNTER — Ambulatory Visit
Admission: RE | Admit: 2017-06-02 | Discharge: 2017-06-02 | Disposition: A | Discharge status: Home / Self Care | Payer: 59 | Source: Ambulatory Visit | Attending: Physician Assistant | Admitting: Physician Assistant

## 2017-06-02 DIAGNOSIS — Z1231 Encounter for screening mammogram for malignant neoplasm of breast: Secondary | ICD-10-CM | POA: Diagnosis not present

## 2017-06-09 ENCOUNTER — Other Ambulatory Visit: Payer: Self-pay | Admitting: Physician Assistant

## 2017-06-09 DIAGNOSIS — R928 Other abnormal and inconclusive findings on diagnostic imaging of breast: Secondary | ICD-10-CM

## 2017-06-09 DIAGNOSIS — N6310 Unspecified lump in the right breast, unspecified quadrant: Secondary | ICD-10-CM

## 2017-06-22 ENCOUNTER — Ambulatory Visit
Admission: RE | Admit: 2017-06-22 | Discharge: 2017-06-22 | Disposition: A | Discharge status: Home / Self Care | Payer: Managed Care, Other (non HMO) | Source: Ambulatory Visit | Attending: Physician Assistant | Admitting: Physician Assistant

## 2017-06-22 DIAGNOSIS — R928 Other abnormal and inconclusive findings on diagnostic imaging of breast: Secondary | ICD-10-CM

## 2017-06-22 DIAGNOSIS — N6313 Unspecified lump in the right breast, lower outer quadrant: Secondary | ICD-10-CM | POA: Diagnosis not present

## 2017-06-22 DIAGNOSIS — N6310 Unspecified lump in the right breast, unspecified quadrant: Secondary | ICD-10-CM

## 2017-07-05 ENCOUNTER — Other Ambulatory Visit: Payer: Self-pay | Admitting: Neurosurgery

## 2017-07-05 DIAGNOSIS — M5412 Radiculopathy, cervical region: Secondary | ICD-10-CM

## 2017-07-11 ENCOUNTER — Ambulatory Visit: Payer: 59

## 2017-11-22 ENCOUNTER — Other Ambulatory Visit: Payer: Self-pay | Admitting: Physician Assistant

## 2017-11-22 DIAGNOSIS — N6001 Solitary cyst of right breast: Secondary | ICD-10-CM

## 2017-12-28 ENCOUNTER — Ambulatory Visit
Admission: RE | Admit: 2017-12-28 | Discharge: 2017-12-28 | Disposition: A | Discharge status: Home / Self Care | Payer: Managed Care, Other (non HMO) | Source: Ambulatory Visit | Attending: Physician Assistant | Admitting: Physician Assistant

## 2017-12-28 ENCOUNTER — Other Ambulatory Visit: Payer: Self-pay | Admitting: Physician Assistant

## 2017-12-28 DIAGNOSIS — N6001 Solitary cyst of right breast: Secondary | ICD-10-CM

## 2018-10-02 ENCOUNTER — Encounter: Admission: RE | Payer: Self-pay | Source: Home / Self Care

## 2018-10-02 ENCOUNTER — Ambulatory Visit
Admission: RE | Admit: 2018-10-02 | Payer: Managed Care, Other (non HMO) | Source: Home / Self Care | Admitting: Unknown Physician Specialty

## 2018-10-02 SURGERY — COLONOSCOPY WITH PROPOFOL
Anesthesia: General

## 2019-10-03 ENCOUNTER — Other Ambulatory Visit: Payer: Self-pay | Admitting: Physician Assistant

## 2019-10-03 DIAGNOSIS — Z1231 Encounter for screening mammogram for malignant neoplasm of breast: Secondary | ICD-10-CM

## 2019-11-20 ENCOUNTER — Ambulatory Visit
Admission: RE | Admit: 2019-11-20 | Discharge: 2019-11-20 | Disposition: A | Discharge status: Home / Self Care | Payer: Managed Care, Other (non HMO) | Source: Ambulatory Visit | Attending: Physician Assistant | Admitting: Physician Assistant

## 2019-11-20 DIAGNOSIS — Z1231 Encounter for screening mammogram for malignant neoplasm of breast: Secondary | ICD-10-CM

## 2019-11-22 ENCOUNTER — Other Ambulatory Visit: Payer: Self-pay | Admitting: Physician Assistant

## 2019-11-22 DIAGNOSIS — R928 Other abnormal and inconclusive findings on diagnostic imaging of breast: Secondary | ICD-10-CM

## 2019-11-22 DIAGNOSIS — N6320 Unspecified lump in the left breast, unspecified quadrant: Secondary | ICD-10-CM

## 2019-11-28 ENCOUNTER — Ambulatory Visit
Admission: RE | Admit: 2019-11-28 | Discharge: 2019-11-28 | Disposition: A | Discharge status: Home / Self Care | Payer: Managed Care, Other (non HMO) | Source: Ambulatory Visit | Attending: Physician Assistant | Admitting: Physician Assistant

## 2019-11-28 DIAGNOSIS — N6320 Unspecified lump in the left breast, unspecified quadrant: Secondary | ICD-10-CM

## 2019-11-28 DIAGNOSIS — R928 Other abnormal and inconclusive findings on diagnostic imaging of breast: Secondary | ICD-10-CM

## 2020-09-29 ENCOUNTER — Other Ambulatory Visit: Payer: Self-pay | Admitting: Physician Assistant

## 2020-09-29 DIAGNOSIS — Z1231 Encounter for screening mammogram for malignant neoplasm of breast: Secondary | ICD-10-CM

## 2020-11-20 ENCOUNTER — Ambulatory Visit
Admission: RE | Admit: 2020-11-20 | Discharge: 2020-11-20 | Disposition: A | Discharge status: Home / Self Care | Payer: Managed Care, Other (non HMO) | Source: Ambulatory Visit | Attending: Physician Assistant | Admitting: Physician Assistant

## 2020-11-20 ENCOUNTER — Other Ambulatory Visit: Payer: Self-pay

## 2020-11-20 DIAGNOSIS — Z1231 Encounter for screening mammogram for malignant neoplasm of breast: Secondary | ICD-10-CM | POA: Diagnosis not present

## 2021-10-20 ENCOUNTER — Other Ambulatory Visit: Payer: Self-pay | Admitting: Physician Assistant

## 2021-10-20 DIAGNOSIS — Z1231 Encounter for screening mammogram for malignant neoplasm of breast: Secondary | ICD-10-CM

## 2021-11-27 ENCOUNTER — Ambulatory Visit
Admission: RE | Admit: 2021-11-27 | Discharge: 2021-11-27 | Disposition: A | Discharge status: Home / Self Care | Payer: Managed Care, Other (non HMO) | Source: Ambulatory Visit | Attending: Physician Assistant | Admitting: Physician Assistant

## 2021-11-27 DIAGNOSIS — Z1231 Encounter for screening mammogram for malignant neoplasm of breast: Secondary | ICD-10-CM | POA: Diagnosis present

## 2021-12-07 ENCOUNTER — Other Ambulatory Visit: Payer: Self-pay | Admitting: Physician Assistant

## 2021-12-07 DIAGNOSIS — R928 Other abnormal and inconclusive findings on diagnostic imaging of breast: Secondary | ICD-10-CM

## 2021-12-08 ENCOUNTER — Ambulatory Visit
Admission: RE | Admit: 2021-12-08 | Discharge: 2021-12-08 | Disposition: A | Discharge status: Home / Self Care | Payer: Managed Care, Other (non HMO) | Source: Ambulatory Visit | Attending: Physician Assistant | Admitting: Physician Assistant

## 2021-12-08 DIAGNOSIS — R928 Other abnormal and inconclusive findings on diagnostic imaging of breast: Secondary | ICD-10-CM | POA: Diagnosis not present

## 2021-12-15 ENCOUNTER — Other Ambulatory Visit: Payer: Self-pay | Admitting: Physician Assistant

## 2021-12-15 DIAGNOSIS — R928 Other abnormal and inconclusive findings on diagnostic imaging of breast: Secondary | ICD-10-CM

## 2021-12-15 DIAGNOSIS — N63 Unspecified lump in unspecified breast: Secondary | ICD-10-CM

## 2021-12-25 ENCOUNTER — Ambulatory Visit
Admission: RE | Admit: 2021-12-25 | Discharge: 2021-12-25 | Disposition: A | Discharge status: Home / Self Care | Payer: Managed Care, Other (non HMO) | Source: Ambulatory Visit | Attending: Physician Assistant | Admitting: Physician Assistant

## 2021-12-25 DIAGNOSIS — R928 Other abnormal and inconclusive findings on diagnostic imaging of breast: Secondary | ICD-10-CM | POA: Insufficient documentation

## 2021-12-25 DIAGNOSIS — N63 Unspecified lump in unspecified breast: Secondary | ICD-10-CM | POA: Diagnosis present

## 2021-12-25 HISTORY — PX: BREAST BIOPSY: SHX20

## 2021-12-28 LAB — SURGICAL PATHOLOGY

## 2022-10-28 ENCOUNTER — Other Ambulatory Visit: Payer: Self-pay

## 2022-10-28 DIAGNOSIS — Z1231 Encounter for screening mammogram for malignant neoplasm of breast: Secondary | ICD-10-CM

## 2022-11-30 ENCOUNTER — Other Ambulatory Visit: Payer: Self-pay | Admitting: Sports Medicine

## 2022-11-30 ENCOUNTER — Ambulatory Visit
Admission: RE | Admit: 2022-11-30 | Discharge: 2022-11-30 | Disposition: A | Discharge status: Home / Self Care | Payer: Medicare HMO | Source: Ambulatory Visit | Attending: Physician Assistant | Admitting: Physician Assistant

## 2022-11-30 DIAGNOSIS — Z1231 Encounter for screening mammogram for malignant neoplasm of breast: Secondary | ICD-10-CM | POA: Insufficient documentation

## 2022-11-30 DIAGNOSIS — M25561 Pain in right knee: Secondary | ICD-10-CM

## 2022-12-13 NOTE — Progress Notes (Signed)
Chief Complaint: Patient was seen in consultation today for bilateral knee pain.   Referring Physician(s): Kubinski,Andrew J  History of Present Illness: Sheila Bender is a 67 y.o. female with a medical history significant for obesity, paroxysmal atrial fibrillation (Eliquis), HTN and osteoarthritis with bilateral knee pain. She has a history of right knee arthroscopy. She has been followed by [facility] for many years for her chronic knee pain and has had many rounds of steroid injections. She has also had the Synvisc injections.  Over the years she has tried conservative management with compression, ice, elevation and OTC pain medications currently on tylenol for arthritis. Any relief she gets from these therapies is short-lived, and steroid injections are not lasting as long as they used to.  She is most symptomatic currently in her right knee.  Pain is worst when climbing stairs.  She finds severe difficulty in getting in and out of the car, on and off the commode, and doing house work.    [place] Pain Score = 51/96 VAS Pain Score = 6/10  She discovered geniculate artery embolization doing research on minimally invasive therapies for knee pain, and sought out consultation.  She wants to avoid surgery as long as she can.   Past Medical History:  Diagnosis Date   Atrial fibrillation (HCC)    Hyperlipidemia    Hypothyroidism    Seasonal allergies    Sleep apnea     Past Surgical History:  Procedure Laterality Date   BREAST BIOPSY Left 12/25/2021   [place] core bx heart clip COLUMNAR CELL CHANGE WITH USUAL DUCTAL HYPERPLASIA, APOCRINE METAPLASIA, SCLEROSING ADENOSIS, ORGANIZING FAT NECROSIS, AND PSEUDOANGIOMATOUS STROMAL HYPERPLASIA.   KNEE ARTHROSCOPY WITH MEDIAL MENISECTOMY Right 07/29/2016   Procedure: KNEE ARTHROSCOPY WITH PARTIAL MEDIAL MENISECTOMY AND SYNOVECTOMY;  Surgeon: Kennedy Bucker, MD;  Location: [facility] ORS;  Service: Orthopedics;  Laterality: Right;   TONSILLECTOMY       Allergies: Sulfa antibiotics  Medications: Prior to Admission medications   Medication Sig Start Date End Date Taking? Authorizing Provider  acetaminophen (TYLENOL) 500 MG tablet Take 1,000 mg by mouth every 6 (six) hours as needed for moderate pain or headache.    [provider]  apixaban (ELIQUIS) 5 MG TABS tablet Take 5 mg by mouth 2 (two) times daily. 03/05/16   [provider]  metoprolol tartrate (LOPRESSOR) 25 MG tablet Take 1 tablet (25 mg total) by mouth 2 (two) times daily. 12/24/15 12/23/16  Emily Filbert, MD  OVER THE COUNTER MEDICATION Place 3 drops under the tongue daily. sublingual allergy drops. Name unknown to pt     [provider]  oxyCODONE (ROXICODONE) 5 MG immediate release tablet Take 1-2 tablets (5-10 mg total) by mouth every 4 (four) hours as needed for severe pain. 07/29/16   Kennedy Bucker, MD     Family History  Problem Relation Age of Onset   Heart failure Mother    Cancer Father        lung   Healthy Sister    Healthy Sister    Healthy Sister    Breast cancer Maternal Aunt 70       2 mat aunts   Breast cancer Maternal Aunt     Social History   Socioeconomic History   Marital status: Married    Spouse name: Not on file   Number of children: Not on file   Years of education: Not on file   Highest education level: Not on file  Occupational  History   Not on file  Tobacco Use   Smoking status: Never   Smokeless tobacco: Never  Substance and Sexual Activity   Alcohol use: No    Alcohol/week: 0.0 standard drinks of alcohol   Drug use: No   Sexual activity: Not on file  Other Topics Concern   Not on file  Social History Narrative   Not on file   Social Determinants of Health   Financial Resource Strain: Not on file  Food Insecurity: Not on file  Transportation Needs: Not on file  Physical Activity: Not on file  Stress: Not on file  Social Connections: Not on file    Review of Systems: A 12 point ROS  discussed and pertinent positives are indicated in the HPI above.  All other systems are negative.  Vital Signs: There were no vitals taken for this visit.  Advance Care Plan: The advanced care plan/surrogate decision maker was discussed at the time of visit and documented in the medical record.    Physical Exam Constitutional:      General: She is not in acute distress. HENT:     Head: Normocephalic.     Mouth/Throat:     Mouth: Mucous membranes are moist.  Eyes:     General: No scleral icterus. Cardiovascular:     Rate and Rhythm: Normal rate and regular rhythm.  Pulmonary:     Effort: Pulmonary effort is normal. No respiratory distress.  Abdominal:     General: There is no distension.  Musculoskeletal:        General: No swelling.     Comments: Tender to palpation about medial aspect of right knee.  Skin:    General: Skin is warm and dry.     Findings: No erythema.  Neurological:     Mental Status: She is alert and oriented to person, place, and time.     Imaging: R Knee x-ray 10/19/2017 EXAM:  Right Knee Radiographs - 3 views (Bilateral AP, Lateral, Bilateral  Sunrise) performed 10/19/2017    CLINICAL INFORMATION: Bilateral knee pain    COMPARISON: Right Knee Radiographs - 3 views (AP, Lateral, Bilateral  Sunrise) performed 12/11/2015; Left Knee Radiographs - 3 views (AP,  Lateral, Bilateral Sunrise) performed 12/10/2016    FINDINGS:   There is stable tricompartmental degenerative joint changes.  She has  osteophyte formation, subchondral sclerosis, joint space narrowing.  No  fractures or dislocations.  No soft tissue swelling or joint effusion.   Well maintained joint spaces without evidence of degenerative joint  disease.  No loose bodies.  No abnormal bone lesions.  The visualized left  knee shows stable tricompartmental degenerative joint changes with  osteophyte formation, subchondral sclerosis.   R knee 12/15/22  Kellgren and Lyman Bishop grade 3 (moderate  OA).  L knee 12/15/22  Kellgren and Lyman Bishop grade 3 (moderate OA).  Labs:  CBC: 10/21/22 Hb 12.5, Hct 37.9  CMP: 10/21/22     Assessment and Plan: 67 year old female with a medical history significant for bilateral, right greater than left, knee pain [facility] 51/96) secondary to moderate (K&G grade 3) osteoarthritis, refractory to conservative measures.  She would be an excellent candidate for geniculate artery embolization and wishes to proceed with such.  -plan for right geniculate artery embolization via left common femoral artery access with moderate sedation at [facility] -if she gets good relief on the right, we can then pursue left GAE at a later date   Marliss Coots, MD Pager: [phone]    I spent  a total of 55 Miinutes   in face to face in clinical consultation, greater than 50% of which was counseling/coordinating care for left knee pain

## 2022-12-15 ENCOUNTER — Other Ambulatory Visit: Payer: Self-pay

## 2022-12-15 ENCOUNTER — Other Ambulatory Visit: Payer: Self-pay | Admitting: Interventional Radiology

## 2022-12-15 ENCOUNTER — Ambulatory Visit
Admission: RE | Admit: 2022-12-15 | Discharge: 2022-12-15 | Disposition: A | Discharge status: Home / Self Care | Payer: Medicare HMO | Source: Ambulatory Visit | Attending: Sports Medicine | Admitting: Sports Medicine

## 2022-12-15 ENCOUNTER — Ambulatory Visit
Admission: RE | Admit: 2022-12-15 | Discharge: 2022-12-15 | Disposition: A | Discharge status: Home / Self Care | Payer: Medicare HMO | Source: Ambulatory Visit | Attending: Interventional Radiology | Admitting: Interventional Radiology

## 2022-12-15 DIAGNOSIS — M25562 Pain in left knee: Secondary | ICD-10-CM

## 2022-12-15 HISTORY — PX: IR RADIOLOGIST EVAL & MGMT: IMG5224

## 2022-12-17 ENCOUNTER — Other Ambulatory Visit: Payer: Self-pay | Admitting: Interventional Radiology

## 2022-12-17 DIAGNOSIS — G8929 Other chronic pain: Secondary | ICD-10-CM

## 2023-01-05 ENCOUNTER — Other Ambulatory Visit: Payer: Self-pay | Admitting: Radiology

## 2023-01-05 ENCOUNTER — Other Ambulatory Visit: Payer: Self-pay | Admitting: Student

## 2023-01-05 DIAGNOSIS — Z01812 Encounter for preprocedural laboratory examination: Secondary | ICD-10-CM

## 2023-01-05 NOTE — Progress Notes (Signed)
Patient for IR RT Knee GAE on Thurs 01/06/2023, I called and spoke with the patient on the phone and gave pre-procedure instructions. Pt was made aware to be here at 7:30a, NPO after MN prior to procedure as well as driver post procedure/recovery/discharge. Pt stated understanding.  Called 12/17/2022 and 01/04/2023  Dr Elby Showers said the patient DID NOT have to hold her ELIQUIS for this procedure.

## 2023-01-05 NOTE — H&P (Signed)
Chief Complaint: Patient was seen in consultation today for  at the request of Sheila Bender  Referring Physician(s): Jerrel Ivory   Supervising Physician: Sheila Bender  Patient Status: [facility] - Out-pt  History of Present Illness: Sheila Bender is a 67 y.o. female with PMHx significant for symptomatic bilateral knee osteoarthritis despite multiple intra-articular steroid and Synvisc injections and pain medications with history of right knee arthroscopy and partial medial menisectomy and synovectomy in 2018. Other pertinent PMHx includes Afib on Eliquis, and HTN. The patient has been seen by Dr. Elby Showers in full consultation on 12/15/22 and deemed a candidate for right geniculate artery embolization, the patient wishes to hold off on total knee surgery at this time and would like to purse this procedure first for pain relief.  The patient denies any current chest pain or shortness of breath. The patient denies any recent infections, fever or chills. The patient does have sleep apnea and using a CPAP. She has no known complications to sedation.    Past Medical History:  Diagnosis Date   Atrial fibrillation (HCC)    Hyperlipidemia    Hypothyroidism    Seasonal allergies    Sleep apnea     Past Surgical History:  Procedure Laterality Date   BREAST BIOPSY Left 12/25/2021   [place] core bx heart clip COLUMNAR CELL CHANGE WITH USUAL DUCTAL HYPERPLASIA, APOCRINE METAPLASIA, SCLEROSING ADENOSIS, ORGANIZING FAT NECROSIS, AND PSEUDOANGIOMATOUS STROMAL HYPERPLASIA.   IR RADIOLOGIST EVAL & MGMT  12/15/2022   KNEE ARTHROSCOPY WITH MEDIAL MENISECTOMY Right 07/29/2016   Procedure: KNEE ARTHROSCOPY WITH PARTIAL MEDIAL MENISECTOMY AND SYNOVECTOMY;  Surgeon: Sheila Bucker, MD;  Location: [facility] ORS;  Service: Orthopedics;  Laterality: Right;   TONSILLECTOMY      Allergies: Sulfa antibiotics  Medications: Prior to Admission medications   Medication Sig Start Date End Date Taking? Authorizing  Provider  acetaminophen (TYLENOL) 500 MG tablet Take 1,000 mg by mouth every 6 (six) hours as needed for moderate pain or headache.    [provider]  apixaban (ELIQUIS) 5 MG TABS tablet Take 5 mg by mouth 2 (two) times daily. 03/05/16   [provider]  metoprolol tartrate (LOPRESSOR) 25 MG tablet Take 1 tablet (25 mg total) by mouth 2 (two) times daily. 12/24/15 12/23/16  Sheila Filbert, MD  OVER THE COUNTER MEDICATION Place 3 drops under the tongue daily. sublingual allergy drops. Name unknown to pt     [provider]  oxyCODONE (ROXICODONE) 5 MG immediate release tablet Take 1-2 tablets (5-10 mg total) by mouth every 4 (four) hours as needed for severe pain. 07/29/16   Sheila Bucker, MD     Family History  Problem Relation Age of Onset   Heart failure Mother    Cancer Father        lung   Healthy Sister    Healthy Sister    Healthy Sister    Breast cancer Maternal Aunt 70       2 mat aunts   Breast cancer Maternal Aunt     Social History   Socioeconomic History   Marital status: Married    Spouse name: Not on file   Number of children: Not on file   Years of education: Not on file   Highest education level: Not on file  Occupational History   Not on file  Tobacco Use   Smoking status: Never   Smokeless tobacco: Never  Substance and Sexual Activity   Alcohol use: No  Alcohol/week: 0.0 standard drinks of alcohol   Drug use: No   Sexual activity: Not on file  Other Topics Concern   Not on file  Social History Narrative   Not on file   Social Determinants of Health   Financial Resource Strain: Low Risk  (10/21/2022)   Received from [facility], [facility]   Overall Financial Resource Strain (CARDIA)    Difficulty of Paying Living Expenses: Not hard at all  Food Insecurity: No Food Insecurity (10/21/2022)   Received from [facility], [facility]   Hunger Vital Sign     Worried About Running Out of Food in the Last Year: Never true    Ran Out of Food in the Last Year: Never true  Transportation Needs: No Transportation Needs (10/21/2022)   Received from [facility], [facility]   [facility] - Transportation    In the past 12 months, has lack of transportation kept you from medical appointments or from getting medications?: No    Lack of Transportation (Non-Medical): No  Physical Activity: Not on file  Stress: Not on file  Social Connections: Not on file    Review of Systems: A 12 point ROS discussed and pertinent positives are indicated in the HPI above.  All other systems are negative.  Review of Systems  Vital Signs: BP (!) 152/81   Pulse (!) 57   Temp 98.6 F (37 C) (Oral)   Resp 20   Ht 5\' 8"  (1.727 m)   Wt 240 lb (108.9 kg)   SpO2 95%   BMI 36.49 kg/m    Physical Exam Constitutional:      General: She is not in acute distress. HENT:     Head: Normocephalic and atraumatic.  Cardiovascular:     Rate and Rhythm: Bradycardia present.  Pulmonary:     Effort: Pulmonary effort is normal. No respiratory distress.     Breath sounds: No wheezing.  Abdominal:     General: There is no distension.     Palpations: Abdomen is soft.     Tenderness: There is no abdominal tenderness.  Musculoskeletal:        General: No swelling.     Comments: DP 1+ bilaterally with doppler, LCFA palpable 1+, bilateral feet warm and sensation and strength intact and equal bilaterally.  Skin:    General: Skin is warm and dry.  Neurological:     Mental Status: She is alert and oriented to person, place, and time.     Imaging: DG Knee 1-2 Views Left  Result Date: 12/15/2022 CLINICAL DATA:  67 year old female with history of left knee osteoarthritis. EXAM: LEFT KNEE - 1-2 VIEW COMPARISON:  None Available. FINDINGS: No evidence of fracture, dislocation, or joint effusion. There is moderate medial compartment joint space narrowing  with associated subchondral sclerosis and mild periarticular osteophyte formation. A fabella is present. Soft tissues are unremarkable. IMPRESSION: Kellgren and Lawrence grade 3 left knee tricompartmental osteoarthritis, most prominent about the medial compartment. Sheila Coots, MD [facility] Radiology Electronically Signed   By: Sheila Bender M.D.   On: 12/15/2022 12:10   DG Knee 1-2 Views Right  Result Date: 12/15/2022 CLINICAL DATA:  66 year old female with history of right knee osteoarthritis. EXAM: RIGHT KNEE - 1-2 VIEW COMPARISON:  None Available. FINDINGS: No evidence of fracture, dislocation, or joint effusion. There is moderate medial compartment joint space narrowing with associated subchondral sclerosis and  mild periarticular osteophyte formation. Soft tissues are unremarkable. IMPRESSION: Kellgren and Lawrence grade 3 right knee osteoarthritis, most prominent about the medial compartment. Sheila Coots, MD [facility] Radiology Electronically Signed   By: Sheila Bender M.D.   On: 12/15/2022 12:10   IR Radiologist Eval & Mgmt  Result Date: 12/15/2022 EXAM: NEW PATIENT OFFICE VISIT CHIEF COMPLAINT: See Epic note. HISTORY OF PRESENT ILLNESS: See Epic note. REVIEW OF SYSTEMS: See Epic note. PHYSICAL EXAMINATION: See Epic note. ASSESSMENT AND PLAN: See Epic note. Sheila Coots, MD [facility] Radiology Electronically Signed   By: Sheila Bender M.D.   On: 12/15/2022 12:05    Labs:  CBC: Recent Labs    01/06/23 0747  WBC 8.2  HGB 12.9  HCT 37.8  PLT 243    COAGS: Recent Labs    01/06/23 0747  INR 1.1    BMP: Recent Labs    01/06/23 0747  NA 136  K 3.8  CL 105  CO2 22  GLUCOSE 103*  BUN 20  CALCIUM 9.4  CREATININE 0.87  GFRNONAA >60    LIVER FUNCTION TESTS: No results for input(s): "BILITOT", "AST", "ALT", "ALKPHOS", "PROT",  "ALBUMIN" in the last 8760 hours.  TUMOR MARKERS: No results for input(s): "AFPTM", "CEA", "CA199", "CHROMGRNA" in the last 8760 hours.  Assessment and Plan: This is a 67 year old female with PMHx significant for symptomatic bilateral knee osteoarthritis despite multiple intra-articular steroid and Synvisc injections and pain medications with history of right knee arthroscopy and partial medial menisectomy and synovectomy in 2018. Other pertinent PMHx includes Afib on Eliquis, and HTN. The patient has been seen by Dr. Elby Showers in full consultation on 12/15/22 and deemed a candidate for right geniculate artery embolization, the patient wishes to hold off on total knee surgery at this time and would like to purse this procedure first for pain relief.   The patient has been NPO, imaging, labs and vitals have been reviewed.  Risks and benefits of image guided right geniculate artery embolization with moderate sedation were discussed with the patient including, but not limited to minimal or no symptomatic relief, bleeding, infection, vascular injury, non-target embolization, and iodine contrast use.  All of the patient's questions were answered, patient is agreeable to proceed.  Consent signed and in chart.   Thank you for this interesting consult.  I greatly enjoyed meeting CHARLICIA BLACKHAM and look forward to participating in their care.  A copy of this report was sent to the requesting provider on this date.  Electronically Signed: Berneta Levins, PA-C 01/06/2023, 8:46 AM   I spent a total of 10 Minutes in face to face in clinical consultation, greater than 50% of which was counseling/coordinating care for right geniculate artery embolization for osteoarthritis.

## 2023-01-06 ENCOUNTER — Encounter: Payer: Self-pay | Admitting: Radiology

## 2023-01-06 ENCOUNTER — Other Ambulatory Visit: Payer: Self-pay

## 2023-01-06 ENCOUNTER — Ambulatory Visit
Admission: RE | Admit: 2023-01-06 | Discharge: 2023-01-06 | Disposition: A | Discharge status: Home / Self Care | Payer: Medicare HMO | Source: Ambulatory Visit | Attending: Interventional Radiology | Admitting: Interventional Radiology

## 2023-01-06 DIAGNOSIS — M25562 Pain in left knee: Secondary | ICD-10-CM | POA: Insufficient documentation

## 2023-01-06 DIAGNOSIS — G8929 Other chronic pain: Secondary | ICD-10-CM | POA: Insufficient documentation

## 2023-01-06 DIAGNOSIS — Z01812 Encounter for preprocedural laboratory examination: Secondary | ICD-10-CM

## 2023-01-06 DIAGNOSIS — M25561 Pain in right knee: Secondary | ICD-10-CM | POA: Diagnosis not present

## 2023-01-06 HISTORY — PX: IR EMBO ARTERIAL NOT HEMORR HEMANG INC GUIDE ROADMAPPING: IMG5448

## 2023-01-06 LAB — CBC
HCT: 37.8 % (ref 36.0–46.0)
Hemoglobin: 12.9 g/dL (ref 12.0–15.0)
MCH: 31 pg (ref 26.0–34.0)
MCHC: 34.1 g/dL (ref 30.0–36.0)
MCV: 90.9 fL (ref 80.0–100.0)
Platelets: 243 10*3/uL (ref 150–400)
RBC: 4.16 MIL/uL (ref 3.87–5.11)
RDW: 12.5 % (ref 11.5–15.5)
WBC: 8.2 10*3/uL (ref 4.0–10.5)
nRBC: 0 % (ref 0.0–0.2)

## 2023-01-06 LAB — PROTIME-INR
INR: 1.1 (ref 0.8–1.2)
Prothrombin Time: 14.8 seconds (ref 11.4–15.2)

## 2023-01-06 LAB — BASIC METABOLIC PANEL
Anion gap: 9 (ref 5–15)
BUN: 20 mg/dL (ref 8–23)
CO2: 22 mmol/L (ref 22–32)
Calcium: 9.4 mg/dL (ref 8.9–10.3)
Chloride: 105 mmol/L (ref 98–111)
Creatinine, Ser: 0.87 mg/dL (ref 0.44–1.00)
GFR, Estimated: >60 mL/min (ref >60)
Glucose, Bld: 103 mg/dL — ABNORMAL HIGH (ref 70–99)
Potassium: 3.8 mmol/L (ref 3.5–5.1)
Sodium: 136 mmol/L (ref 135–145)

## 2023-01-06 MED ORDER — KETOROLAC TROMETHAMINE 60 MG/2ML IM SOLN
INTRAMUSCULAR | Status: AC
  Filled 2023-01-06: qty 2

## 2023-01-06 MED ORDER — NITROGLYCERIN 1 MG/10 ML FOR IR/CATH LAB
INTRA_ARTERIAL | Status: AC
  Filled 2023-01-06: qty 10

## 2023-01-06 MED ORDER — FENTANYL CITRATE (PF) 100 MCG/2ML IJ SOLN
INTRAMUSCULAR | Status: AC
  Filled 2023-01-06: qty 2

## 2023-01-06 MED ORDER — SODIUM CHLORIDE 0.9 % IV SOLN: INTRAVENOUS | Status: DC

## 2023-01-06 MED ORDER — IODIXANOL 270 MG/ML IV SOLN
115.0000 mL | Freq: Once | INTRAVENOUS | Status: AC
  Administered 2023-01-06: 115 mL
  Filled 2023-01-06: qty 115

## 2023-01-06 MED ORDER — DEXAMETHASONE SODIUM PHOSPHATE 10 MG/ML IJ SOLN
10.0000 mg | Freq: Once | INTRAMUSCULAR | Status: AC
  Administered 2023-01-06: 10 mg via INTRAVENOUS
  Filled 2023-01-06: qty 1

## 2023-01-06 MED ORDER — LIDOCAINE HCL 1 % IJ SOLN
INTRAMUSCULAR | Status: AC
  Filled 2023-01-06: qty 20

## 2023-01-06 MED ORDER — MIDAZOLAM HCL 2 MG/2ML IJ SOLN
INTRAMUSCULAR | Status: AC
  Filled 2023-01-06: qty 4

## 2023-01-06 MED ORDER — NAPROXEN 500 MG PO TABS: 500.0000 mg | ORAL_TABLET | Freq: Two times a day (BID) | ORAL | 0 refills | Status: AC

## 2023-01-06 MED ORDER — NITROGLYCERIN 1 MG/10 ML FOR IR/CATH LAB
INTRA_ARTERIAL | Status: AC | PRN
  Administered 2023-01-06: 100 ug via INTRA_ARTERIAL

## 2023-01-06 MED ORDER — METHYLPREDNISOLONE 4 MG PO TBPK: ORAL_TABLET | ORAL | 0 refills | Status: AC

## 2023-01-06 MED ORDER — KETOROLAC TROMETHAMINE 30 MG/ML IJ SOLN
INTRAMUSCULAR | Status: AC
  Filled 2023-01-06: qty 1

## 2023-01-06 MED ORDER — ACETAMINOPHEN 10 MG/ML IV SOLN
1000.0000 mg | Freq: Once | INTRAVENOUS | Status: AC
  Administered 2023-01-06: 1000 mg via INTRAVENOUS
  Filled 2023-01-06: qty 100

## 2023-01-06 MED ORDER — FENTANYL CITRATE (PF) 100 MCG/2ML IJ SOLN
INTRAMUSCULAR | Status: AC | PRN
  Administered 2023-01-06: 25 ug via INTRAVENOUS
  Administered 2023-01-06: 50 ug via INTRAVENOUS
  Administered 2023-01-06 (×5): 25 ug via INTRAVENOUS

## 2023-01-06 MED ORDER — LIDOCAINE HCL 1 % IJ SOLN
5.0000 mL | Freq: Once | INTRAMUSCULAR | Status: AC
  Administered 2023-01-06: 5 mL via INTRADERMAL

## 2023-01-06 MED ORDER — KETOROLAC TROMETHAMINE 30 MG/ML IJ SOLN
30.0000 mg | Freq: Once | INTRAMUSCULAR | Status: AC
  Administered 2023-01-06: 30 mg via INTRAVENOUS

## 2023-01-06 MED ORDER — ACETAMINOPHEN 10 MG/ML IV SOLN
INTRAVENOUS | Status: AC
  Filled 2023-01-06: qty 100

## 2023-01-06 MED ORDER — MIDAZOLAM HCL 2 MG/2ML IJ SOLN
INTRAMUSCULAR | Status: AC | PRN
  Administered 2023-01-06 (×2): .5 mg via INTRAVENOUS
  Administered 2023-01-06: 1 mg via INTRAVENOUS
  Administered 2023-01-06 (×4): .5 mg via INTRAVENOUS

## 2023-01-06 NOTE — Procedures (Signed)
Interventional Radiology Procedure Note  Procedure:  1) Limited right lower extremity angiogram 2) Particle embolization of right descending geniculate articular branches  Findings: Please refer to procedural dictation for full description. Unsuccessful catheterization of superior medial geniculate branch due to diminutive size and tortuosity.  Successful catheterization of articular branches of descending geniculate artery supplying patellofemoral and medial femoral condylar synovium.  Successful particle embolization of each.  6 Fr left CFA Angioseal closure.  Complications: None immediate  Estimated Blood Loss: < 5 mL  Recommendations: 90 minute bedrest Medrol dosepak and 500 mg naproxen BID for 5 days IR will arrange 1 month follow up   Marliss Coots, MD

## 2023-01-06 NOTE — Progress Notes (Signed)
Ice pack changed on right knee

## 2023-02-28 NOTE — Progress Notes (Signed)
Referring Physician(s): Jerrel Ivory  Reason for follow up:  The patient is seen in virtual telephone follow up today 1 month s/p right geniculate artery embolization 01/06/23  History of present illness: HPI from initial consult 12/15/22 Sheila Bender is a 67 y.o. female with a medical history significant for obesity, paroxysmal atrial fibrillation (Eliquis), HTN and osteoarthritis with bilateral knee pain. She has a history of right knee arthroscopy. She has been followed by [facility] for many years for her chronic knee pain and has had many rounds of steroid injections. She has also had the Synvisc injections.  Over the years she has tried conservative management with compression, ice, elevation and OTC pain medications currently on tylenol for arthritis. Any relief she gets from these therapies is short-lived, and steroid injections are not lasting as long as they used to.  She is most symptomatic currently in her right knee.  Pain is worst when climbing stairs.  She finds severe difficulty in getting in and out of the car, on and off the commode, and doing house work.     Womac Pain Score = 51/96 VAS Pain Score = 6/10   She discovered geniculate artery embolization doing research on minimally invasive therapies for knee pain, and sought out consultation.  She wants to avoid surgery as long as she can.  She is now s/p right geniculate artery embolization 01/06/23. She presents today for follow up and to discuss possibly treating the left knee.   She has noticed a big improvement in her knee pain, considers it a success.  She did have some bruising about her left groin access site that took a couple of weeks to resolve.  She has noticed some numbing in this region.   She does have improved mobility and is able to be more active.  She states that her left knee now bothers her more due to the subsidence of the right knee pain.   Womac Pain Score = 37/96 VAS Pain Score = 4/10  Past  Medical History:  Diagnosis Date   Atrial fibrillation (HCC)    Hyperlipidemia    Hypothyroidism    Seasonal allergies    Sleep apnea     Past Surgical History:  Procedure Laterality Date   BREAST BIOPSY Left 12/25/2021   [place] core bx heart clip COLUMNAR CELL CHANGE WITH USUAL DUCTAL HYPERPLASIA, APOCRINE METAPLASIA, SCLEROSING ADENOSIS, ORGANIZING FAT NECROSIS, AND PSEUDOANGIOMATOUS STROMAL HYPERPLASIA.   IR EMBO ARTERIAL NOT HEMORR HEMANG INC GUIDE ROADMAPPING  01/06/2023   IR RADIOLOGIST EVAL & MGMT  12/15/2022   KNEE ARTHROSCOPY WITH MEDIAL MENISECTOMY Right 07/29/2016   Procedure: KNEE ARTHROSCOPY WITH PARTIAL MEDIAL MENISECTOMY AND SYNOVECTOMY;  Surgeon: Kennedy Bucker, MD;  Location: [facility] ORS;  Service: Orthopedics;  Laterality: Right;   TONSILLECTOMY      Allergies: Sulfa antibiotics  Medications: Prior to Admission medications   Medication Sig Start Date End Date Taking? Authorizing Provider  acetaminophen (TYLENOL) 500 MG tablet Take 1,000 mg by mouth every 6 (six) hours as needed for moderate pain or headache.    [provider]  amLODipine (NORVASC) 5 MG tablet Take 5 mg by mouth daily.    [provider]  apixaban (ELIQUIS) 5 MG TABS tablet Take 5 mg by mouth 2 (two) times daily. 03/05/16   [provider]  losartan (COZAAR) 25 MG tablet Take 25 mg by mouth daily.    [provider]  methylPREDNISolone (MEDROL DOSEPAK) 4 MG TBPK tablet Day 1  take 5 tablets (20 mg total), day 2 take 4 tablets (16 mg), day 3 take 3 tablets (12 mg), day 4 take 2 tablets (8mg ), day 5 take 1 tablet (4mg ), then stop. 01/06/23   Pattricia Boss D, PA-C  metoprolol tartrate (LOPRESSOR) 25 MG tablet Take 1 tablet (25 mg total) by mouth 2 (two) times daily. 12/24/15 12/23/16  Emily Filbert, MD  OVER THE COUNTER MEDICATION Place 3 drops under the tongue daily. sublingual allergy drops. Name unknown to pt     [provider]  oxyCODONE (ROXICODONE) 5 MG  immediate release tablet Take 1-2 tablets (5-10 mg total) by mouth every 4 (four) hours as needed for severe pain. Patient not taking: Reported on 01/06/2023 07/29/16   Kennedy Bucker, MD     Family History  Problem Relation Age of Onset   Heart failure Mother    Cancer Father        lung   Healthy Sister    Healthy Sister    Healthy Sister    Breast cancer Maternal Aunt 70       2 mat aunts   Breast cancer Maternal Aunt     Social History   Socioeconomic History   Marital status: Married    Spouse name: Not on file   Number of children: Not on file   Years of education: Not on file   Highest education level: Not on file  Occupational History   Not on file  Tobacco Use   Smoking status: Never   Smokeless tobacco: Never  Substance and Sexual Activity   Alcohol use: No    Alcohol/week: 0.0 standard drinks of alcohol   Drug use: No   Sexual activity: Not on file  Other Topics Concern   Not on file  Social History Narrative   Not on file   Social Determinants of Health   Financial Resource Strain: Low Risk  (10/21/2022)   Received [facility], [facility]   Overall Financial Resource Strain (CARDIA)    Difficulty of Paying Living Expenses: Not hard at all  Food Insecurity: No Food Insecurity (10/21/2022)   Received from [facility], [facility]   Hunger Vital Sign    Worried About Running Out of Food in the Last Year: Never true    Ran Out of Food in the Last Year: Never true  Transportation Needs: No Transportation Needs (10/21/2022)   Received from [facility], [facility]   [facility] - Transportation    In the past 12 months, has lack of transportation kept you from medical appointments or from getting medications?: No    Lack of Transportation (Non-Medical): No  Physical Activity: Not on file  Stress: Not on file  Social Connections: Not on file     Vital  Signs: There were no vitals taken for this visit.  No physical examination was performed in lieu of virtual telephone clinic visit.   Imaging: R Knee x-ray 10/19/2017 EXAM:  Right Knee Radiographs - 3 views (Bilateral AP, Lateral, Bilateral  Sunrise) performed 10/19/2017    CLINICAL INFORMATION: Bilateral knee pain    COMPARISON: Right Knee Radiographs - 3 views (AP, Lateral, Bilateral  Sunrise) performed 12/11/2015; Left Knee Radiographs - 3 views (AP,  Lateral, Bilateral Sunrise) performed 12/10/2016    FINDINGS:   There is stable tricompartmental degenerative joint changes.  She has  osteophyte formation, subchondral sclerosis, joint space narrowing.  No  fractures or dislocations.  No soft tissue swelling or joint effusion.   Well maintained joint spaces without evidence of degenerative joint  disease.  No loose bodies.  No abnormal bone lesions.  The visualized left  knee shows stable tricompartmental degenerative joint changes with  osteophyte formation, subchondral sclerosis.    R knee 12/15/22  Kellgren and Lyman Bishop grade 3 (moderate OA).   L knee 12/15/22  Kellgren and Lyman Bishop grade 3 (moderate OA).    01/06/23 Right GAE    Technically successful particulate embolization of medial patellofemoral and medial femoral condylar articular branches of the descending geniculate artery. (0.6 mL 100-300 micron embospheres)   Labs:  CBC: Recent Labs    01/06/23 0747  WBC 8.2  HGB 12.9  HCT 37.8  PLT 243    COAGS: Recent Labs    01/06/23 0747  INR 1.1    BMP: Recent Labs    01/06/23 0747  NA 136  K 3.8  CL 105  CO2 22  GLUCOSE 103*  BUN 20  CALCIUM 9.4  CREATININE 0.87  GFRNONAA >60    LIVER FUNCTION TESTS: No results for input(s): "BILITOT", "AST", "ALT", "ALKPHOS", "PROT", "ALBUMIN" in the last 8760 hours.  Assessment and Plan:  68 year old female with a medical history significant for bilateral, right greater than left, knee pain [facility]  51/96 --> 37/96) secondary to moderate (K&G grade 3) osteoarthritis, refractory to conservative measures.   She is now nearly 2 months out after right GAE with significantly improved pain and mobility.  She is interested in pursuing left knee GAE.  Plan for left knee GAE at [facility], she is hoping for early November 2024.    Marliss Coots, MD Pager: [phone]     I spent a total of 40 Minutes in face to face in clinical consultation, greater than 50% of which was counseling/coordinating care for right knee pain.

## 2023-03-02 ENCOUNTER — Other Ambulatory Visit: Payer: Self-pay | Admitting: Interventional Radiology

## 2023-03-02 ENCOUNTER — Encounter: Payer: Self-pay | Admitting: Interventional Radiology

## 2023-03-02 ENCOUNTER — Ambulatory Visit
Admission: RE | Admit: 2023-03-02 | Discharge: 2023-03-02 | Disposition: A | Discharge status: Home / Self Care | Payer: Medicare HMO | Source: Ambulatory Visit | Attending: Radiology | Admitting: Radiology

## 2023-03-02 DIAGNOSIS — G8929 Other chronic pain: Secondary | ICD-10-CM

## 2023-03-02 DIAGNOSIS — M1712 Unilateral primary osteoarthritis, left knee: Secondary | ICD-10-CM

## 2023-03-02 HISTORY — PX: IR RADIOLOGIST EVAL & MGMT: IMG5224

## 2023-10-31 ENCOUNTER — Other Ambulatory Visit: Payer: Self-pay | Admitting: Physician Assistant

## 2023-10-31 DIAGNOSIS — Z1231 Encounter for screening mammogram for malignant neoplasm of breast: Secondary | ICD-10-CM

## 2024-01-25 ENCOUNTER — Ambulatory Visit
Admission: RE | Admit: 2024-01-25 | Discharge: 2024-01-25 | Disposition: A | Discharge status: Home / Self Care | Source: Ambulatory Visit | Attending: Physician Assistant | Admitting: Physician Assistant

## 2024-01-25 DIAGNOSIS — Z1231 Encounter for screening mammogram for malignant neoplasm of breast: Secondary | ICD-10-CM | POA: Diagnosis present

## 2024-01-26 IMAGING — US US BREAST*L* LIMITED INC AXILLA
2 series · 10 of 10 positions shown · non-contrast
Comparison: Previous exam(s).

CLINICAL DATA: Recall from screening mammography, possible
asymmetry involving the inner LEFT breast at middle depth.

EXAM:
DIGITAL DIAGNOSTIC UNILATERAL LEFT MAMMOGRAM WITH TOMOSYNTHESIS AND
CAD; ULTRASOUND LEFT BREAST LIMITED
TECHNIQUE: Left digital diagnostic mammography and breast tomosynthesis was
performed. The images were evaluated with computer-aided detection.;
Targeted ultrasound examination of the left breast was performed.

[Series 1: us brst ltd uni left inc trans 20 · 8 of 8 slices shown (1 of 2)]
[im 1/8]
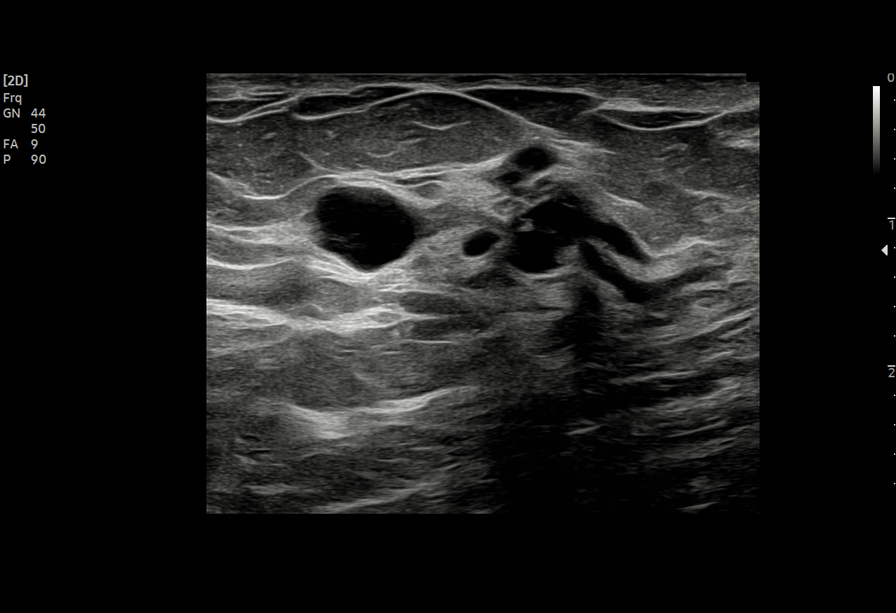
[im 2/8]
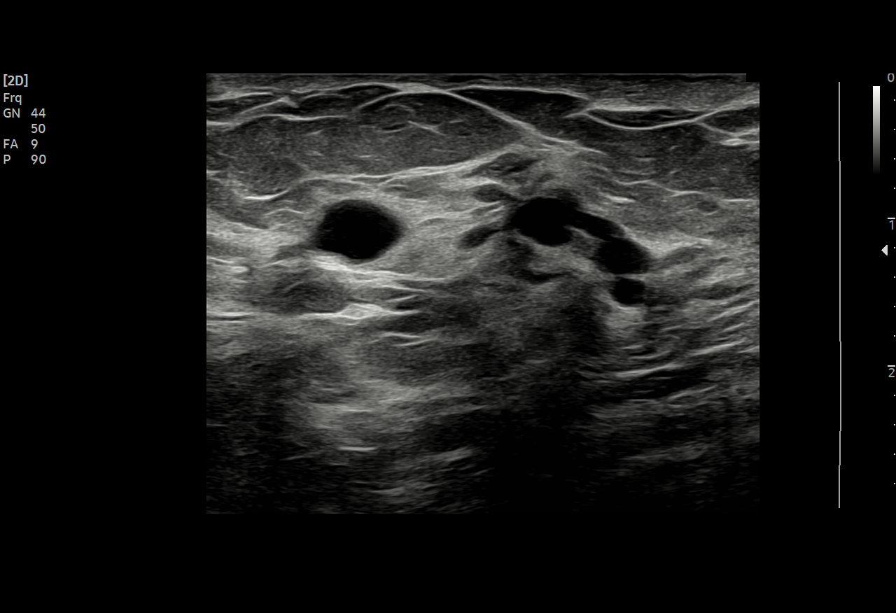
[im 3/8]
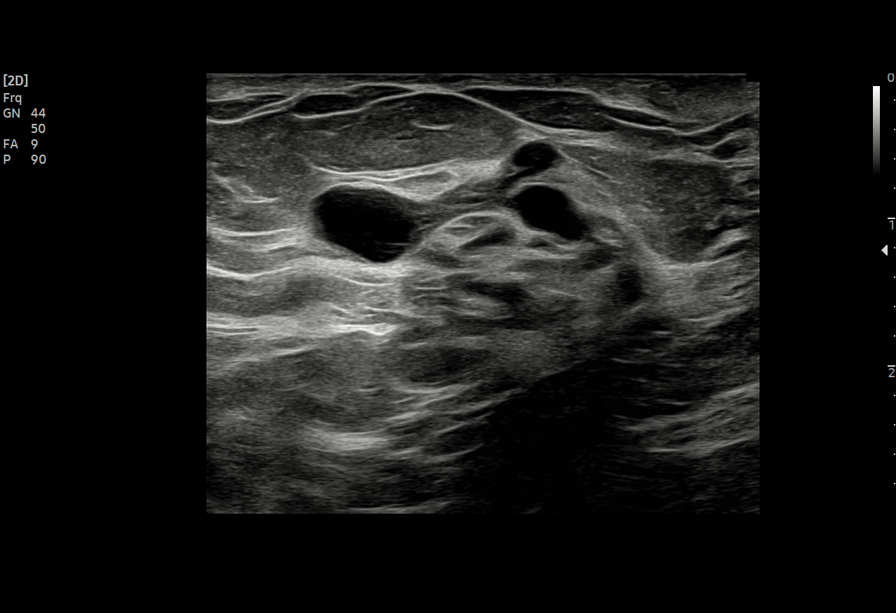
[im 4/8]
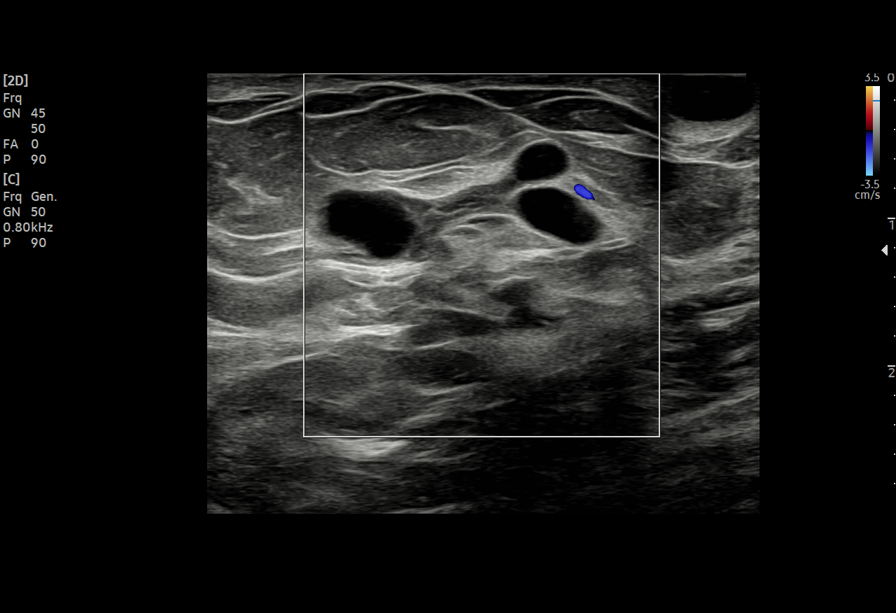
[im 5/8]
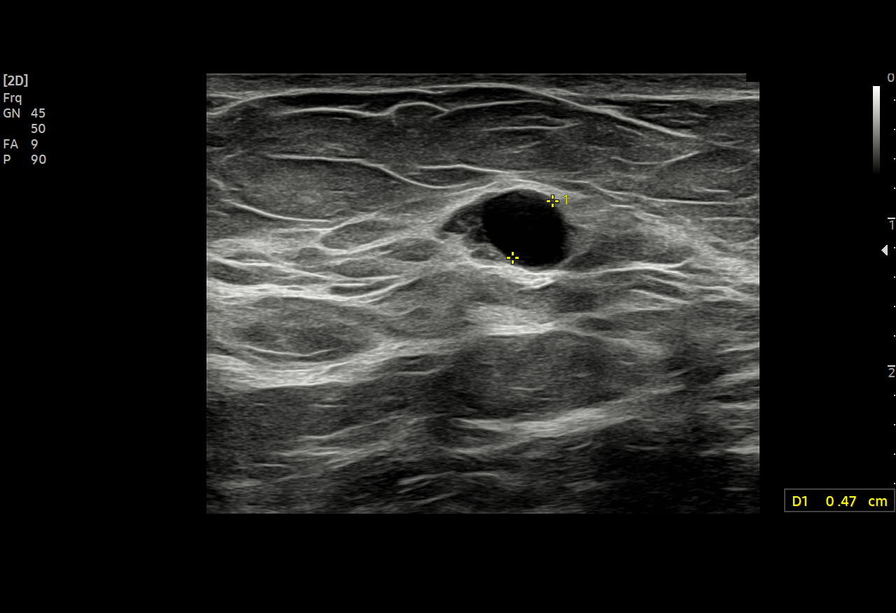
[im 6/8]
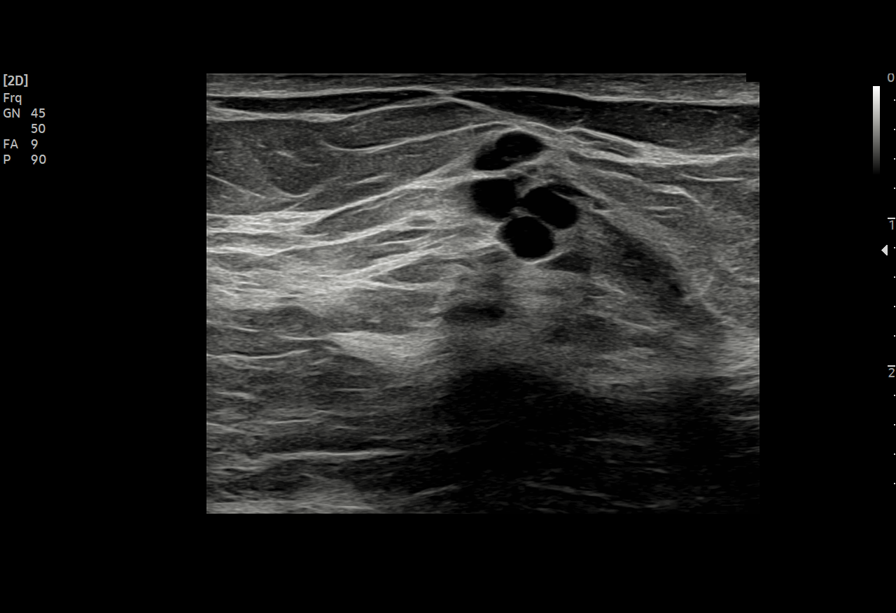
[im 7/8]
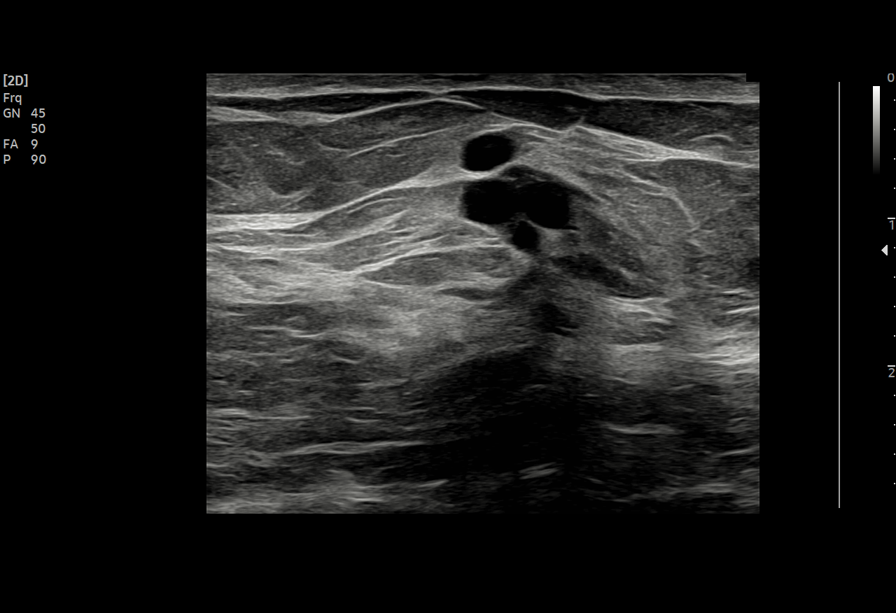
[im 8/8]
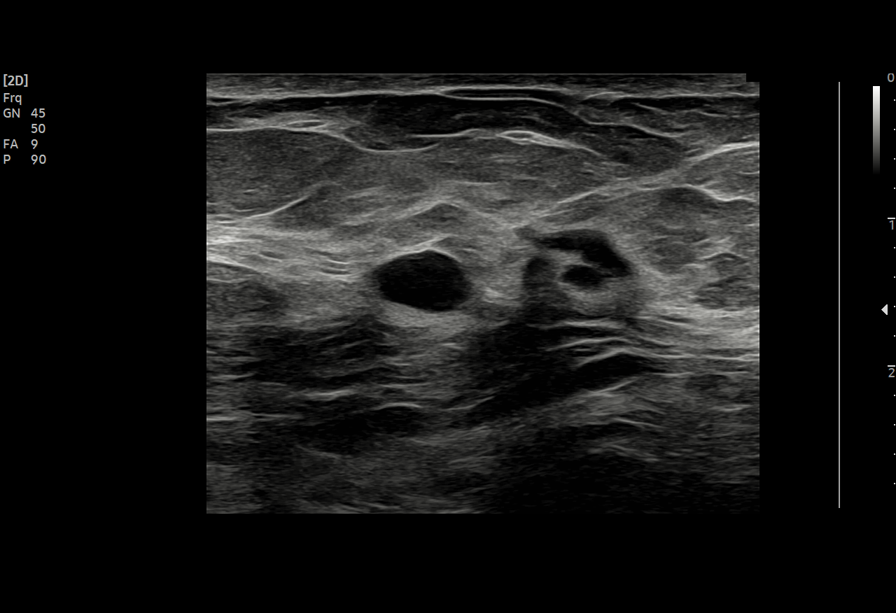

[Series 2: us brst ltd uni left inc trans 20 · 2 of 2 slices shown (2 of 2)]
[im 1/2]
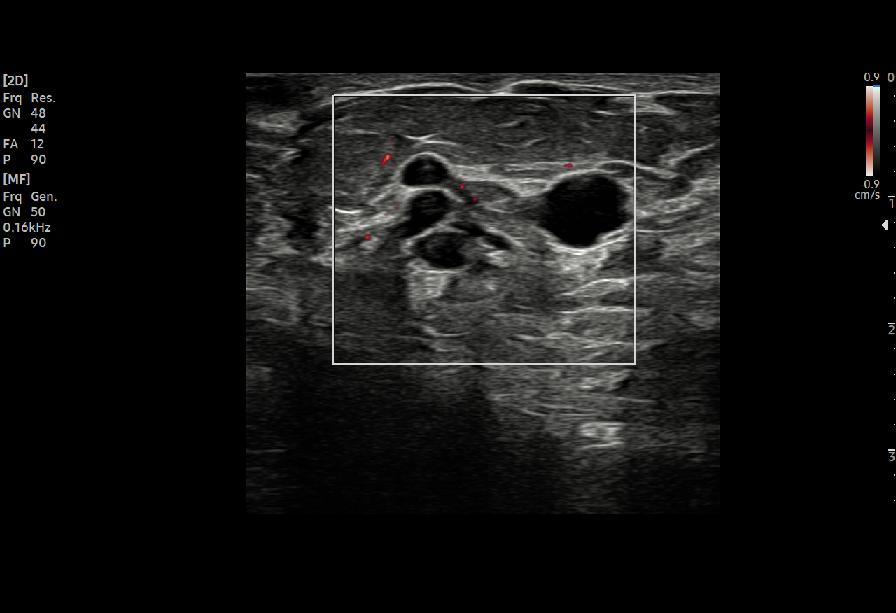
[im 2/2]
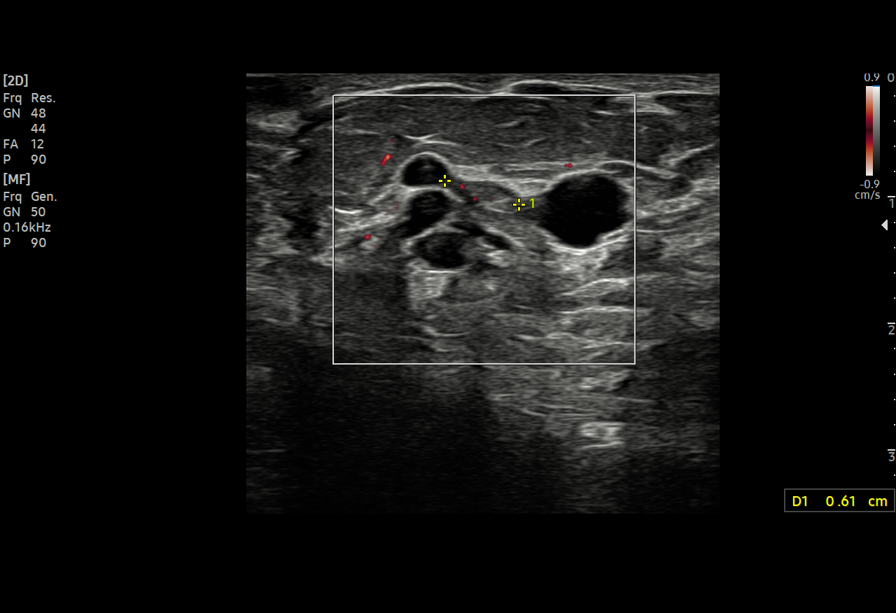

[10 of 10 positions shown; findings below may reference images not displayed]

ACR Breast Density Category c: The breast tissue is heterogeneously
dense, which may obscure small masses.
FINDINGS: Spot-compression CC and MLO views of the area of concern were
obtained.

The asymmetry in the inner breast at middle depth questioned on
screening mammography has the appearance of focal beaded duct
ectasia on the spot compression views. There is no associated
architectural distortion or suspicious calcifications. The likely
duct ectasia spans approximately 3 cm.

Targeted ultrasound is performed, demonstrating focal duct ectasia
at the 8 o'clock position 2 cm from the nipple. Most of the dilated
ducts are fluid-filled. However, there is a portion of a duct which
contains a 0.8 cm intraductal mass which demonstrates power Doppler
flow. This intraductal masses within a more superficial dilated
duct.
IMPRESSION: Indeterminate 0.8 cm intraductal mass within localized duct ectasia
spanning 3 cm in the LOWER INNER QUADRANT of the LEFT breast at 8
o'clock 2 cm from the nipple.

RECOMMENDATION:
Ultrasound-guided core needle biopsy of the intraductal mass.

I have discussed the findings and recommendations with the patient.
The ultrasound core needle biopsy procedure was discussed with the
patient and her questions were answered. She wishes to proceed with
the biopsy. She will be contacted by the Breast Navigator at
[HOSPITAL] in order to schedule the procedure.

BI-RADS CATEGORY  4: Suspicious.

## 2024-01-26 IMAGING — MG MM DIGITAL DIAGNOSTIC UNILAT*L* W/ TOMO W/ CAD
4 series · 4 of 12 positions shown · non-contrast
Comparison: Previous exam(s).

CLINICAL DATA: Recall from screening mammography, possible
asymmetry involving the inner LEFT breast at middle depth.

EXAM:
DIGITAL DIAGNOSTIC UNILATERAL LEFT MAMMOGRAM WITH TOMOSYNTHESIS AND
CAD; ULTRASOUND LEFT BREAST LIMITED
TECHNIQUE: Left digital diagnostic mammography and breast tomosynthesis was
performed. The images were evaluated with computer-aided detection.;
Targeted ultrasound examination of the left breast was performed.

[L CC synth-2D]
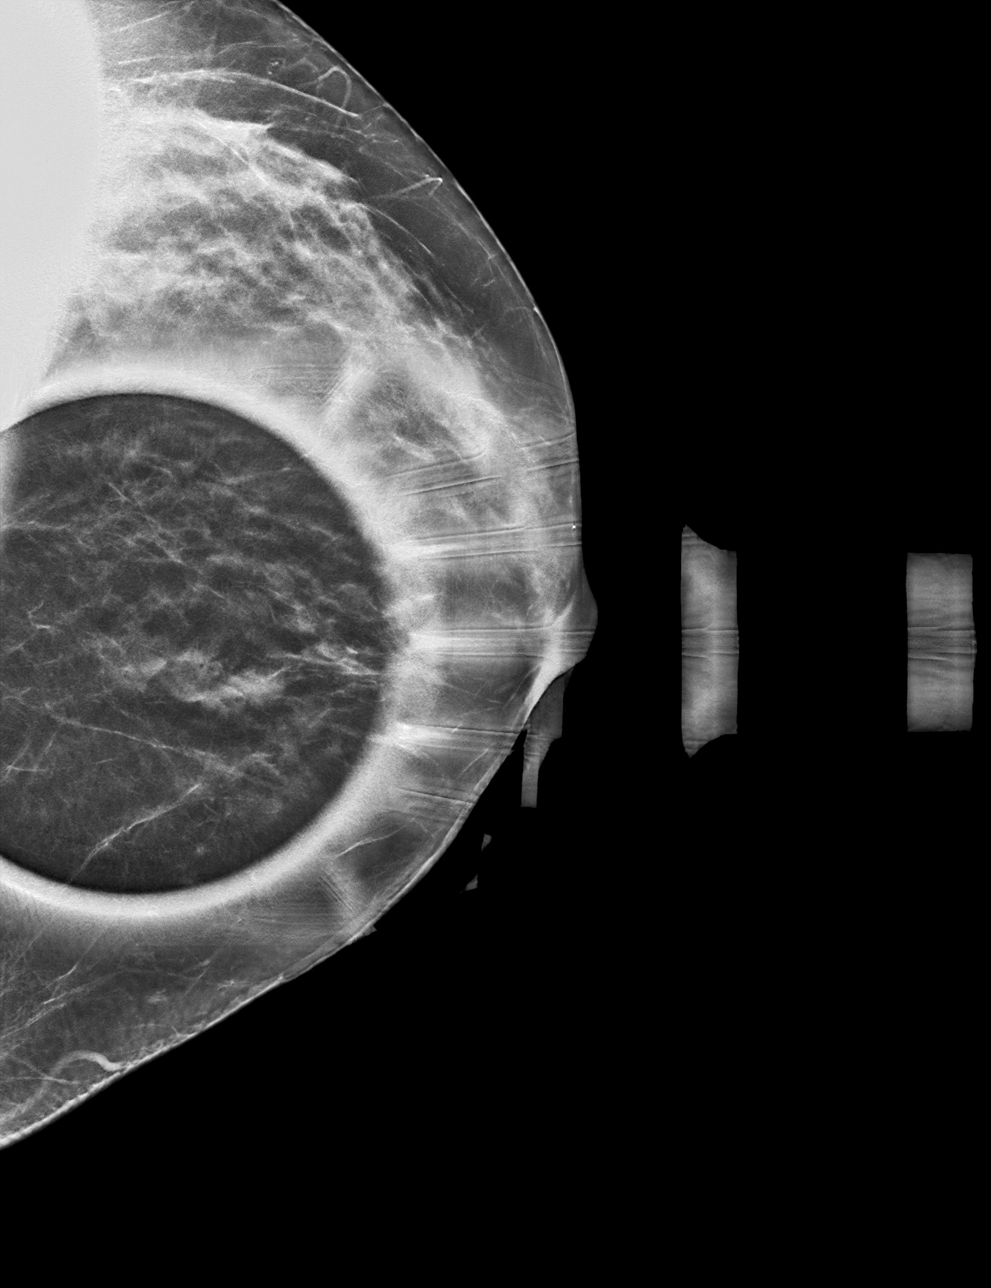

[L MLO synth-2D]
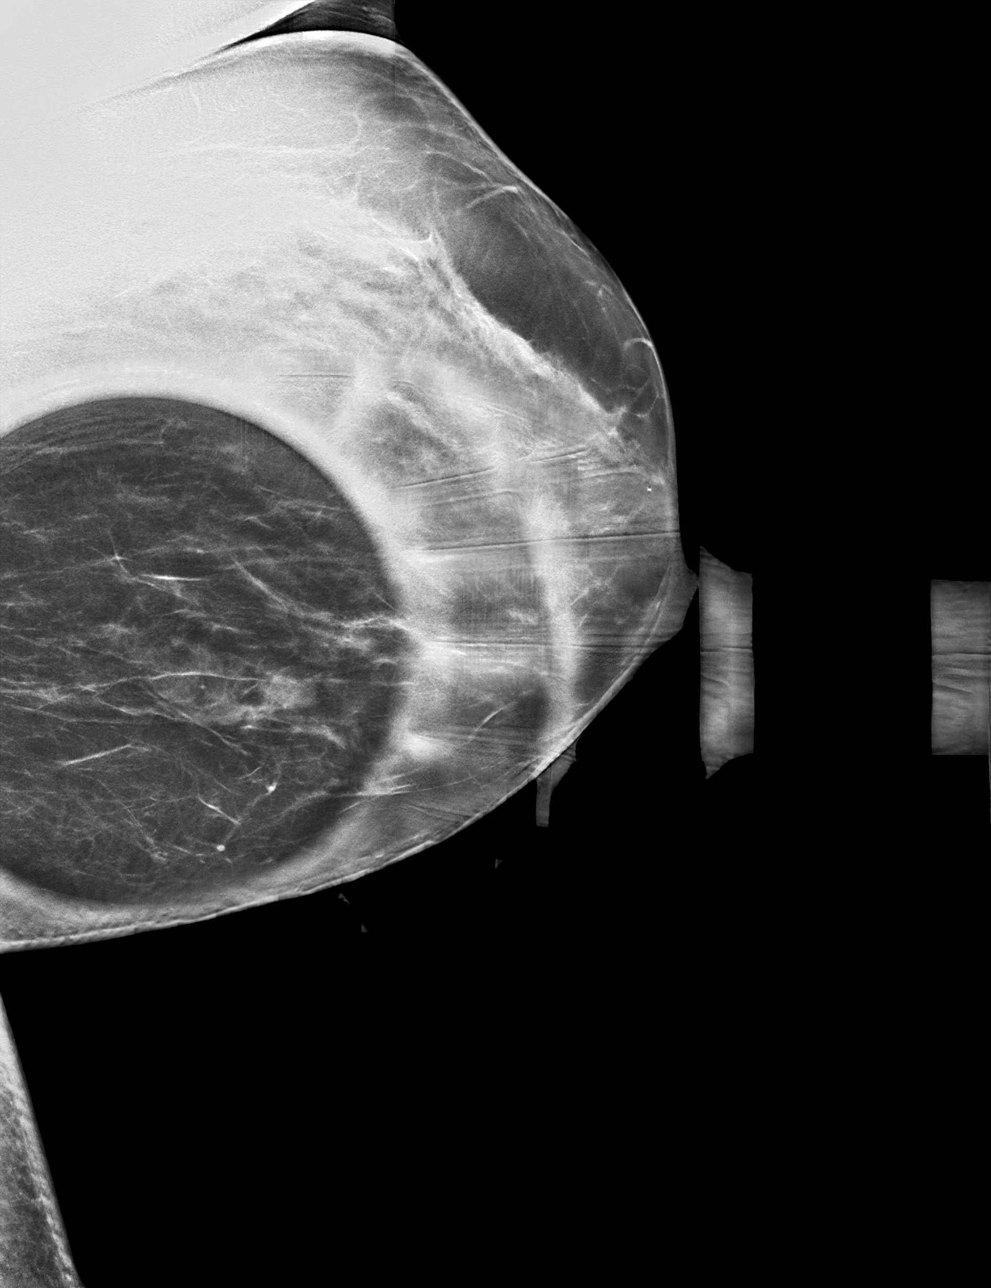

[L CC tomo · tomo slice 27/54.0]
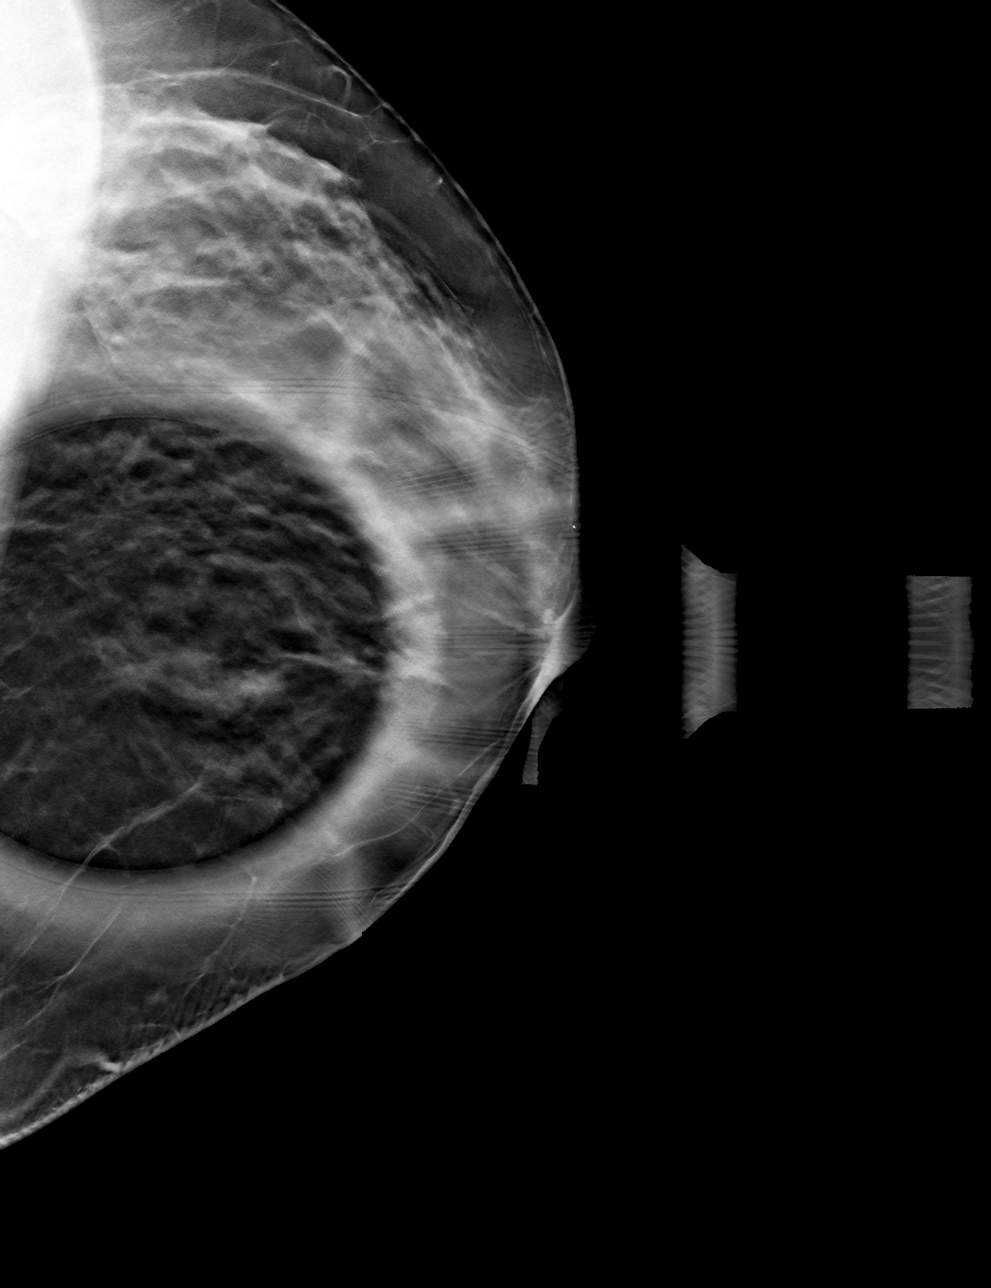

[L MLO tomo · tomo slice 33/65.0]
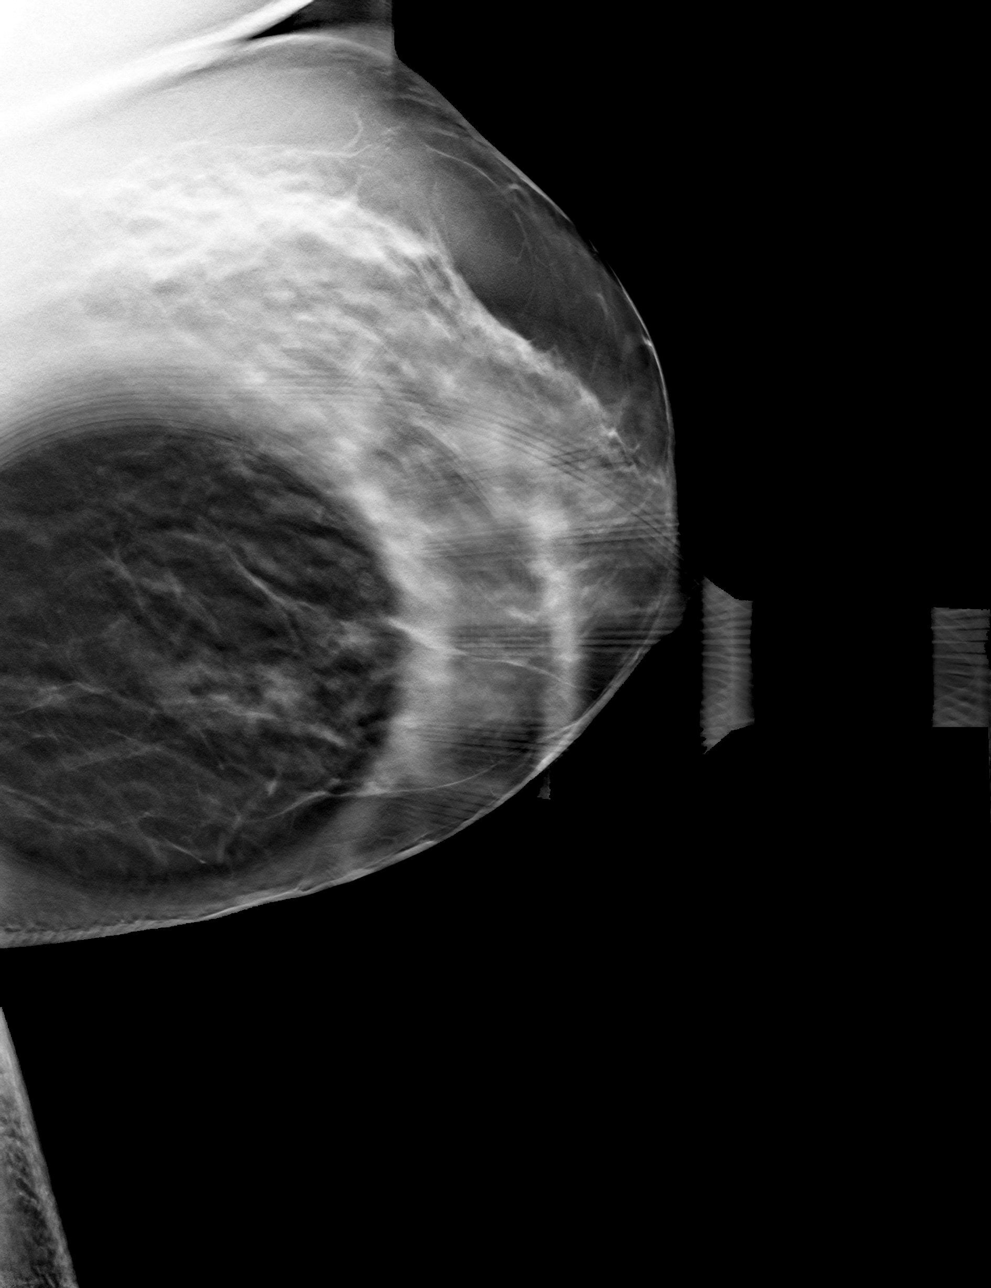

[4 of 12 positions shown; findings below may reference images not displayed]

ACR Breast Density Category c: The breast tissue is heterogeneously
dense, which may obscure small masses.
FINDINGS: Spot-compression CC and MLO views of the area of concern were
obtained.

The asymmetry in the inner breast at middle depth questioned on
screening mammography has the appearance of focal beaded duct
ectasia on the spot compression views. There is no associated
architectural distortion or suspicious calcifications. The likely
duct ectasia spans approximately 3 cm.

Targeted ultrasound is performed, demonstrating focal duct ectasia
at the 8 o'clock position 2 cm from the nipple. Most of the dilated
ducts are fluid-filled. However, there is a portion of a duct which
contains a 0.8 cm intraductal mass which demonstrates power Doppler
flow. This intraductal masses within a more superficial dilated
duct.
IMPRESSION: Indeterminate 0.8 cm intraductal mass within localized duct ectasia
spanning 3 cm in the LOWER INNER QUADRANT of the LEFT breast at 8
o'clock 2 cm from the nipple.

RECOMMENDATION:
Ultrasound-guided core needle biopsy of the intraductal mass.

I have discussed the findings and recommendations with the patient.
The ultrasound core needle biopsy procedure was discussed with the
patient and her questions were answered. She wishes to proceed with
the biopsy. She will be contacted by the Breast Navigator at
[HOSPITAL] in order to schedule the procedure.

BI-RADS CATEGORY  4: Suspicious.

## 2024-02-01 ENCOUNTER — Encounter

## 2024-07-12 ENCOUNTER — Encounter: Payer: Self-pay | Admitting: Ophthalmology

## 2024-07-17 NOTE — Anesthesia Preprocedure Evaluation (Signed)
 Anesthesia Evaluation    Airway        Dental   Pulmonary           Cardiovascular      Neuro/Psych    GI/Hepatic   Endo/Other    Renal/GU      Musculoskeletal   Abdominal   Peds  Hematology   Anesthesia Other Findings   Reproductive/Obstetrics                              Anesthesia Physical Anesthesia Plan Anesthesia Quick Evaluation

## 2024-07-17 NOTE — Discharge Instructions (Signed)

## 2024-07-18 ENCOUNTER — Encounter: Payer: Self-pay | Admitting: Ophthalmology

## 2024-07-18 ENCOUNTER — Other Ambulatory Visit: Payer: Self-pay

## 2024-07-18 ENCOUNTER — Encounter
Admission: RE | Disposition: A | Discharge status: Home / Self Care | Payer: Self-pay | Source: Home / Self Care | Attending: Ophthalmology

## 2024-07-18 ENCOUNTER — Encounter: Payer: Self-pay | Admitting: Anesthesiology

## 2024-07-18 ENCOUNTER — Ambulatory Visit
Admission: RE | Admit: 2024-07-18 | Discharge: 2024-07-18 | Disposition: A | Attending: Ophthalmology | Admitting: Ophthalmology

## 2024-07-18 DIAGNOSIS — H2511 Age-related nuclear cataract, right eye: Secondary | ICD-10-CM | POA: Insufficient documentation

## 2024-07-18 MED ORDER — MIDAZOLAM HCL (PF) 2 MG/2ML IJ SOLN
INTRAMUSCULAR | Status: DC | PRN
  Administered 2024-07-18 (×2): 1 mg via INTRAVENOUS

## 2024-07-18 MED ORDER — TETRACAINE HCL 0.5 % OP SOLN
1.0000 [drp] | OPHTHALMIC | Status: DC | PRN
  Administered 2024-07-18 (×3): 1 [drp] via OPHTHALMIC

## 2024-07-18 MED ORDER — CYCLOPENTOLATE HCL 2 % OP SOLN
OPHTHALMIC | Status: AC
  Filled 2024-07-18: qty 2

## 2024-07-18 MED ORDER — CEFUROXIME OPHTHALMIC INJECTION 1 MG/0.1 ML
INJECTION | OPHTHALMIC | Status: DC | PRN
  Administered 2024-07-18: 1 mg via INTRACAMERAL

## 2024-07-18 MED ORDER — FENTANYL CITRATE (PF) 100 MCG/2ML IJ SOLN
INTRAMUSCULAR | Status: AC
  Filled 2024-07-18: qty 2

## 2024-07-18 MED ORDER — CYCLOPENTOLATE HCL 2 % OP SOLN
1.0000 [drp] | OPHTHALMIC | Status: DC | PRN
  Administered 2024-07-18 (×3): 1 [drp] via OPHTHALMIC

## 2024-07-18 MED ORDER — SIGHTPATH DOSE#1 BSS IO SOLN
INTRAOCULAR | Status: DC | PRN
  Administered 2024-07-18: 15 mL via INTRAOCULAR

## 2024-07-18 MED ORDER — SIGHTPATH DOSE#1 BSS IO SOLN
INTRAOCULAR | Status: DC | PRN
  Administered 2024-07-18: 53 mL via OPHTHALMIC

## 2024-07-18 MED ORDER — SIGHTPATH DOSE#1 NA HYALUR & NA CHOND-NA HYALUR IO KIT
PACK | INTRAOCULAR | Status: DC | PRN
  Administered 2024-07-18: 1 via OPHTHALMIC

## 2024-07-18 MED ORDER — PHENYLEPHRINE HCL 10 % OP SOLN
OPHTHALMIC | Status: AC
  Filled 2024-07-18: qty 5

## 2024-07-18 MED ORDER — BRIMONIDINE TARTRATE-TIMOLOL 0.2-0.5 % OP SOLN
OPHTHALMIC | Status: DC | PRN
  Administered 2024-07-18: 1 [drp] via OPHTHALMIC

## 2024-07-18 MED ORDER — LIDOCAINE HCL (PF) 2 % IJ SOLN
INTRAOCULAR | Status: DC | PRN
  Administered 2024-07-18: 2 mL

## 2024-07-18 MED ORDER — LACTATED RINGERS IV SOLN: INTRAVENOUS | Status: DC

## 2024-07-18 MED ORDER — TETRACAINE HCL 0.5 % OP SOLN
OPHTHALMIC | Status: AC
  Filled 2024-07-18: qty 4

## 2024-07-18 MED ORDER — PHENYLEPHRINE HCL 10 % OP SOLN
1.0000 [drp] | OPHTHALMIC | Status: DC | PRN
  Administered 2024-07-18 (×3): 1 [drp] via OPHTHALMIC

## 2024-07-18 MED ORDER — FENTANYL CITRATE (PF) 100 MCG/2ML IJ SOLN
INTRAMUSCULAR | Status: DC | PRN
  Administered 2024-07-18: 50 ug via INTRAVENOUS

## 2024-07-18 MED ORDER — MIDAZOLAM HCL 2 MG/2ML IJ SOLN
INTRAMUSCULAR | Status: AC
  Filled 2024-07-18: qty 2

## 2024-07-18 NOTE — Anesthesia Postprocedure Evaluation (Signed)
"   Anesthesia Post Note  Patient: Sheila Bender  Procedure(s) Performed: PHACOEMULSIFICATION, CATARACT, WITH IOL INSERTION 6.27 00:29.6 (Right: Eye)  Patient location during evaluation: PACU Anesthesia Type: MAC Level of consciousness: awake and alert Pain management: pain level controlled Vital Signs Assessment: post-procedure vital signs reviewed and stable Respiratory status: spontaneous breathing, nonlabored ventilation, respiratory function stable and patient connected to nasal cannula oxygen Cardiovascular status: blood pressure returned to baseline and stable Postop Assessment: no apparent nausea or vomiting Anesthetic complications: no   No notable events documented.   Last Vitals:  Vitals:   07/18/24 1141 07/18/24 1145  BP: 127/73   Pulse: 67   Resp: 12   Temp: 36.4 C (P) 36.4 C  SpO2: 97%     Last Pain:  Vitals:   07/18/24 1145  TempSrc:   PainSc: (P) 0-No pain                 [name]      "

## 2024-07-18 NOTE — Transfer of Care (Signed)
 Immediate Anesthesia Transfer of Care Note  Patient: Sheila Bender  Procedure(s) Performed: PHACOEMULSIFICATION, CATARACT, WITH IOL INSERTION 6.27 00:29.6 (Right: Eye)  Patient Location: PACU  Anesthesia Type: MAC  Level of Consciousness: awake, alert  and patient cooperative  Airway and Oxygen Therapy: Patient Spontanous Breathing and Patient connected to supplemental oxygen  Post-op Assessment: Post-op Vital signs reviewed, Patient's Cardiovascular Status Stable, Respiratory Function Stable, Patent Airway and No signs of Nausea or vomiting  Post-op Vital Signs: Reviewed and stable  Complications: No notable events documented.

## 2024-07-18 NOTE — H&P (Signed)
 " [facility]   Primary Care Physician:  Marikay Eva POUR, [place] Ophthalmologist: Dr. Dene Etienne  Pre-Procedure History & Physical: HPI:  Sheila Bender is a 69 y.o. female here for ophthalmic surgery.   Past Medical History:  Diagnosis Date   Atrial fibrillation (HCC)    Hyperlipidemia    Seasonal allergies    Sleep apnea     Past Surgical History:  Procedure Laterality Date   BREAST BIOPSY Left 12/25/2021   Us  core bx heart clip COLUMNAR CELL CHANGE WITH USUAL DUCTAL HYPERPLASIA, APOCRINE METAPLASIA, SCLEROSING ADENOSIS, ORGANIZING FAT NECROSIS, AND PSEUDOANGIOMATOUS STROMAL HYPERPLASIA.   IR EMBO ARTERIAL NOT HEMORR HEMANG INC GUIDE ROADMAPPING  01/06/2023   IR RADIOLOGIST EVAL & MGMT  12/15/2022   IR RADIOLOGIST EVAL & MGMT  03/02/2023   KNEE ARTHROSCOPY WITH MEDIAL MENISECTOMY Right 07/29/2016   Procedure: KNEE ARTHROSCOPY WITH PARTIAL MEDIAL MENISECTOMY AND SYNOVECTOMY;  Surgeon: Ozell Flake, MD;  Location: [facility] ORS;  Service: Orthopedics;  Laterality: Right;   TONSILLECTOMY      Prior to Admission medications  Medication Sig Start Date End Date Taking? Authorizing Provider  acetaminophen  (TYLENOL ) 500 MG tablet Take 650 mg by mouth every 6 (six) hours as needed for moderate pain (pain score 4-6) or headache.   Yes [provider]  amLODipine (NORVASC) 5 MG tablet Take 5 mg by mouth daily.   Yes [provider]  apixaban (ELIQUIS) 5 MG TABS tablet Take 5 mg by mouth 2 (two) times daily. 03/05/16  Yes [provider]  cetirizine (ZYRTEC) 5 MG tablet Take 5 mg by mouth daily.   Yes [provider]  losartan (COZAAR) 25 MG tablet Take 25 mg by mouth daily.   Yes [provider]  metoprolol  tartrate (LOPRESSOR ) 25 MG tablet Take 1 tablet (25 mg total) by mouth 2 (two) times daily. 12/24/15 07/18/24 Yes Trudy Dorn BRAVO, MD  Multiple Vitamin (MULTIVITAMIN) tablet Take 1 tablet by mouth daily.   Yes [provider]  rosuvastatin (CRESTOR) 5 MG tablet Take 5 mg by mouth daily.   Yes [provider]  methylPREDNISolone  (MEDROL  DOSEPAK) 4 MG TBPK tablet Day 1 take 5 tablets (20 mg total), day 2 take 4 tablets (16 mg), day 3 take 3 tablets (12 mg), day 4 take 2 tablets (8mg ), day 5 take 1 tablet (4mg ), then stop. Patient not taking: Reported on 07/12/2024 01/06/23   Morgan, Koreen D, PA-C  OVER THE COUNTER MEDICATION Place 3 drops under the tongue daily. sublingual allergy drops. Name unknown to pt  Patient not taking: Reported on 07/12/2024    [provider]  oxyCODONE  (ROXICODONE ) 5 MG immediate release tablet Take 1-2 tablets (5-10 mg total) by mouth every 4 (four) hours as needed for severe pain. Patient not taking: Reported on 01/06/2023 07/29/16   Flake Ozell, MD    Allergies as of 07/03/2024 - Reviewed 01/06/2023  Allergen Reaction Noted   Sulfa antibiotics Rash 01/27/2015    Family History  Problem Relation Age of Onset   Heart failure Mother    Cancer Father        lung   Healthy Sister    Healthy Sister    Healthy Sister    Breast cancer Maternal Aunt 70       2 mat aunts   Breast cancer Maternal Aunt     Social History   Socioeconomic History   Marital status: Married    Spouse name: Not on file  Number of children: Not on file   Years of education: Not on file   Highest education level: Not on file  Occupational History   Not on file  Tobacco Use   Smoking status: Never   Smokeless tobacco: Never  Substance and Sexual Activity   Alcohol use: No    Alcohol/week: 0.0 standard drinks of alcohol   Drug use: No   Sexual activity: Not on file  Other Topics Concern   Not on file  Social History Narrative   Not on file   Social Drivers of Health   Tobacco Use: Low Risk (07/18/2024)   Patient History    Smoking Tobacco Use: Never    Smokeless Tobacco Use: Never    Passive Exposure: Not on file  Financial Resource Strain: Low Risk  (06/12/2024)    Received from [facility]   Overall Financial Resource Strain (CARDIA)    Difficulty of Paying Living Expenses: Not hard at all  Food Insecurity: No Food Insecurity (06/12/2024)   Received from [facility]   Epic    Within the past 12 months, you worried that your food would run out before you got the money to buy more.: Never true    Within the past 12 months, the food you bought just didn't last and you didn't have money to get more.: Never true  Transportation Needs: No Transportation Needs (06/12/2024)   Received from [facility] - Transportation    In the past 12 months, has lack of transportation kept you from medical appointments or from getting medications?: No    Lack of Transportation (Non-Medical): No  Physical Activity: Not on file  Stress: Not on file  Social Connections: Not on file  Intimate Partner Violence: Not on file  Depression (EYV7-0): Not on file  Alcohol Screen: Not on file  Housing: Low Risk  (06/12/2024)   Received from [facility]   Epic    In the last 12 months, was there a time when you were not able to pay the mortgage or rent on time?: No    In the past 12 months, how many times have you moved where you were living?: 0    At any time in the past 12 months, were you homeless or living in a shelter (including now)?: No  Utilities: Not At Risk (06/12/2024)   Received from [facility]   Epic    In the past 12 months has the electric, gas, oil, or water company threatened to shut off services in your home?: No  Health Literacy: Not on file    Review of Systems: See HPI, otherwise negative ROS  Physical Exam: BP (!) 148/83   Pulse 64   Temp 97.6 F (36.4 C) (Temporal)   Resp 14   Ht 5' 7.99 (1.727 m)   Wt 106.1 kg   SpO2 98%   BMI 35.59 kg/m  General:   Alert,  pleasant and cooperative in NAD Head:  Normocephalic and atraumatic. Lungs:  Clear to  auscultation.    Heart:  Regular rate and rhythm.   Impression/Plan: Sheila Bender is here for ophthalmic surgery.  Risks, benefits, limitations, and alternatives regarding ophthalmic surgery have been reviewed with the patient.  Questions have been answered.  All parties agreeable.   MITTIE GASKIN, MD  07/18/2024, 10:55 AM  "

## 2024-07-18 NOTE — Op Note (Signed)
 LOCATION:  [facility]   PREOPERATIVE DIAGNOSIS:  Nuclear sclerotic cataract of the right eye.  H25.11   POSTOPERATIVE DIAGNOSIS:  Nuclear sclerotic cataract of the right eye.   PROCEDURE:  Phacoemulsification with Toric posterior chamber intraocular lens placement of the right eye.  Ultrasound time: Procedures: PHACOEMULSIFICATION, CATARACT, WITH IOL INSERTION 6.27 00:29.6 (Right)  LENS:   Implant Name Type Inv. Item Serial No. Manufacturer Lot No. LRB No. Used Action  LENS IOL PANO PRO TORC 3 14.0 - [id number]  LENS IOL PANO PRO TORC 3 14.0 [id number] [employer]  Right 1 Implanted     [id number] Panoptix Toric intraocular lens with 1.5 diopters of cylindrical power with axis orientation at 80 degrees.   SURGEON:  Dene FABIENE Etienne, MD   ANESTHESIA: Topical with tetracaine  drops and 2% Xylocaine  jelly, augmented with 1% preservative-free intracameral lidocaine . .   COMPLICATIONS:  None.   DESCRIPTION OF PROCEDURE:  The patient was identified in the holding room and transported to the operating suite and placed in the supine position under the operating microscope.  The right eye was identified as the operative eye, and it was prepped and draped in the usual sterile ophthalmic fashion.    A clear-corneal paracentesis incision was made at the 12:00 position.  0.5 ml of preservative-free 1% lidocaine  was injected into the anterior chamber. The anterior chamber was filled with Viscoat.  A 2.4 millimeter near clear corneal incision was then made at the 9:00 position.  A cystotome and capsulorrhexis forceps were then used to make a curvilinear capsulorrhexis.  Hydrodissection and hydrodelineation were then performed using balanced salt solution.   Phacoemulsification was then used in stop and chop fashion to remove the lens, nucleus and epinucleus.  The remaining cortex was aspirated using the irrigation and aspiration handpiece.  Provisc viscoelastic was then placed into the  capsular bag to distend it for lens placement.  The Verion digital marker was used to align the implant at the intended axis.   A Toric lens was then injected into the capsular bag.  It was rotated clockwise until the axis marks on the lens were approximately 15 degrees in the counterclockwise direction to the intended alignment.  The viscoelastic was aspirated from the eye using the irrigation aspiration handpiece.  Then, a Koch spatula through the sideport incision was used to rotate the lens in a clockwise direction until the axis markings of the intraocular lens were lined up with the Verion alignment.  Balanced salt solution was then used to hydrate the wounds. Cefuroxime  0.1 ml of a 10mg /ml solution was injected into the anterior chamber for a dose of 1 mg of intracameral antibiotic at the completion of the case.    The eye was noted to have a physiologic pressure and there was no wound leak noted.   Timolol  and Brimonidine  drops were applied to the eye.  The patient was taken to the recovery room in stable condition having had no complications of anesthesia or surgery.  Alegandra Sommers 07/18/2024, 11:39 AM

## 2024-08-01 ENCOUNTER — Encounter: Admission: RE | Payer: Self-pay | Source: Home / Self Care

## 2024-08-01 ENCOUNTER — Encounter: Payer: Self-pay | Admitting: Anesthesiology

## 2024-08-01 ENCOUNTER — Ambulatory Visit: Admission: RE | Admit: 2024-08-01 | Payer: Self-pay | Admitting: Ophthalmology

## 2025-07-10 ENCOUNTER — Ambulatory Visit
